# Patient Record
Sex: Male | Born: 1955
Health system: Southern US, Community
[De-identification: ages and names within clinical notes are randomized; demographics above are authoritative.]

## PROBLEM LIST (undated history)

## (undated) DIAGNOSIS — M1990 Unspecified osteoarthritis, unspecified site: Secondary | ICD-10-CM

## (undated) DIAGNOSIS — M5386 Other specified dorsopathies, lumbar region: Secondary | ICD-10-CM

## (undated) DIAGNOSIS — M549 Dorsalgia, unspecified: Secondary | ICD-10-CM

## (undated) DIAGNOSIS — I10 Essential (primary) hypertension: Secondary | ICD-10-CM

## (undated) DIAGNOSIS — K219 Gastro-esophageal reflux disease without esophagitis: Secondary | ICD-10-CM

## (undated) HISTORY — PX: MEDIAL PARTIAL KNEE REPLACEMENT: SHX5965

## (undated) HISTORY — DX: Essential (primary) hypertension: I10

## (undated) HISTORY — DX: Unspecified osteoarthritis, unspecified site: M19.90

## (undated) HISTORY — PX: SHOULDER SURGERY: SHX246

## (undated) HISTORY — DX: Gastro-esophageal reflux disease without esophagitis: K21.9

---

## 2001-08-21 ENCOUNTER — Encounter: Payer: Self-pay | Admitting: Orthopedic Surgery

## 2001-08-21 ENCOUNTER — Inpatient Hospital Stay (HOSPITAL_COMMUNITY): Admission: RE | Admit: 2001-08-21 | Discharge: 2001-08-25 | Payer: Self-pay | Admitting: Orthopedic Surgery

## 2001-08-23 ENCOUNTER — Encounter: Payer: Self-pay | Admitting: Orthopedic Surgery

## 2004-09-03 ENCOUNTER — Ambulatory Visit: Payer: Self-pay | Admitting: Family Medicine

## 2004-09-10 ENCOUNTER — Ambulatory Visit: Payer: Self-pay | Admitting: Family Medicine

## 2004-09-21 ENCOUNTER — Ambulatory Visit: Payer: Self-pay | Admitting: Gastroenterology

## 2004-10-02 ENCOUNTER — Ambulatory Visit (HOSPITAL_COMMUNITY): Admission: RE | Admit: 2004-10-02 | Discharge: 2004-10-02 | Payer: Self-pay | Admitting: Gastroenterology

## 2004-10-02 ENCOUNTER — Ambulatory Visit: Payer: Self-pay | Admitting: Gastroenterology

## 2005-09-14 ENCOUNTER — Ambulatory Visit: Payer: Self-pay | Admitting: Family Medicine

## 2005-09-20 ENCOUNTER — Ambulatory Visit: Payer: Self-pay | Admitting: Family Medicine

## 2005-09-23 ENCOUNTER — Ambulatory Visit: Payer: Self-pay | Admitting: Gastroenterology

## 2005-10-18 ENCOUNTER — Ambulatory Visit: Payer: Self-pay | Admitting: Gastroenterology

## 2006-08-08 ENCOUNTER — Ambulatory Visit: Payer: Self-pay | Admitting: Family Medicine

## 2006-08-22 ENCOUNTER — Inpatient Hospital Stay (HOSPITAL_COMMUNITY): Admission: RE | Admit: 2006-08-22 | Discharge: 2006-08-24 | Payer: Self-pay | Admitting: Orthopedic Surgery

## 2006-09-08 ENCOUNTER — Ambulatory Visit: Payer: Self-pay | Admitting: Family Medicine

## 2006-10-21 ENCOUNTER — Ambulatory Visit: Payer: Self-pay | Admitting: Family Medicine

## 2006-10-31 ENCOUNTER — Ambulatory Visit: Payer: Self-pay | Admitting: Gastroenterology

## 2006-11-07 ENCOUNTER — Ambulatory Visit: Payer: Self-pay | Admitting: Gastroenterology

## 2006-11-29 ENCOUNTER — Ambulatory Visit: Payer: Self-pay | Admitting: Gastroenterology

## 2006-12-05 ENCOUNTER — Ambulatory Visit: Payer: Self-pay | Admitting: Family Medicine

## 2006-12-05 LAB — CONVERTED CEMR LAB
ALT: 27 units/L (ref 0–40)
AST: 24 units/L (ref 0–37)
Albumin: 3.6 g/dL (ref 3.5–5.2)
Alkaline Phosphatase: 81 units/L (ref 39–117)
BUN: 13 mg/dL (ref 6–23)
Basophils Absolute: 0 10*3/uL (ref 0.0–0.1)
Basophils Relative: 0.6 % (ref 0.0–1.0)
Bilirubin, Direct: 0.1 mg/dL (ref 0.0–0.3)
CO2: 33 meq/L — ABNORMAL HIGH (ref 19–32)
Calcium: 9.4 mg/dL (ref 8.4–10.5)
Chloride: 105 meq/L (ref 96–112)
Cholesterol: 156 mg/dL (ref 0–200)
Creatinine, Ser: 1 mg/dL (ref 0.4–1.5)
Eosinophils Absolute: 0.2 10*3/uL (ref 0.0–0.6)
Eosinophils Relative: 4.2 % (ref 0.0–5.0)
GFR calc Af Amer: 102 mL/min
GFR calc non Af Amer: 84 mL/min
Glucose, Bld: 104 mg/dL — ABNORMAL HIGH (ref 70–99)
HCT: 38.8 % — ABNORMAL LOW (ref 39.0–52.0)
HDL: 45.6 mg/dL (ref >39.0)
Hemoglobin: 12.9 g/dL — ABNORMAL LOW (ref 13.0–17.0)
LDL Cholesterol: 95 mg/dL (ref 0–99)
Lymphocytes Relative: 31.7 % (ref 12.0–46.0)
MCHC: 33.2 g/dL (ref 30.0–36.0)
MCV: 78.1 fL (ref 78.0–100.0)
Monocytes Absolute: 0.5 10*3/uL (ref 0.2–0.7)
Monocytes Relative: 11.2 % — ABNORMAL HIGH (ref 3.0–11.0)
Neutro Abs: 2.4 10*3/uL (ref 1.4–7.7)
Neutrophils Relative %: 52.3 % (ref 43.0–77.0)
PSA: 0.76 ng/mL (ref 0.10–4.00)
Platelets: 320 10*3/uL (ref 150–400)
Potassium: 4.7 meq/L (ref 3.5–5.1)
RBC: 4.96 M/uL (ref 4.22–5.81)
RDW: 14.3 % (ref 11.5–14.6)
Sodium: 142 meq/L (ref 135–145)
TSH: 1.55 microintl units/mL (ref 0.35–5.50)
Total Bilirubin: 0.6 mg/dL (ref 0.3–1.2)
Total CHOL/HDL Ratio: 3.4
Total Protein: 7.3 g/dL (ref 6.0–8.3)
Triglycerides: 76 mg/dL (ref 0–149)
VLDL: 15 mg/dL (ref 0–40)
WBC: 4.5 10*3/uL (ref 4.5–10.5)

## 2006-12-13 ENCOUNTER — Ambulatory Visit: Payer: Self-pay | Admitting: Family Medicine

## 2007-06-29 DIAGNOSIS — M1990 Unspecified osteoarthritis, unspecified site: Secondary | ICD-10-CM | POA: Insufficient documentation

## 2007-06-29 DIAGNOSIS — I10 Essential (primary) hypertension: Secondary | ICD-10-CM | POA: Insufficient documentation

## 2007-06-29 DIAGNOSIS — J309 Allergic rhinitis, unspecified: Secondary | ICD-10-CM | POA: Insufficient documentation

## 2007-06-29 DIAGNOSIS — F17200 Nicotine dependence, unspecified, uncomplicated: Secondary | ICD-10-CM | POA: Insufficient documentation

## 2007-11-29 ENCOUNTER — Telehealth: Payer: Self-pay | Admitting: *Deleted

## 2007-11-29 ENCOUNTER — Telehealth: Payer: Self-pay | Admitting: Family Medicine

## 2007-12-11 ENCOUNTER — Ambulatory Visit: Payer: Self-pay | Admitting: Family Medicine

## 2007-12-11 LAB — CONVERTED CEMR LAB
Albumin: 3.7 g/dL (ref 3.5–5.2)
BUN: 16 mg/dL (ref 6–23)
Basophils Relative: 0.3 % (ref 0.0–1.0)
Blood in Urine, dipstick: NEGATIVE
Calcium: 9.4 mg/dL (ref 8.4–10.5)
Creatinine, Ser: 1.1 mg/dL (ref 0.4–1.5)
Eosinophils Absolute: 0.3 10*3/uL (ref 0.0–0.7)
Eosinophils Relative: 6.5 % — ABNORMAL HIGH (ref 0.0–5.0)
GFR calc Af Amer: 91 mL/min
GFR calc non Af Amer: 75 mL/min
Glucose, Bld: 102 mg/dL — ABNORMAL HIGH (ref 70–99)
Glucose, Urine, Semiquant: NEGATIVE
HCT: 42 % (ref 39.0–52.0)
HDL: 32.3 mg/dL — ABNORMAL LOW (ref >39.0)
Hemoglobin: 13.4 g/dL (ref 13.0–17.0)
Ketones, urine, test strip: NEGATIVE
MCV: 81.9 fL (ref 78.0–100.0)
Monocytes Absolute: 0.5 10*3/uL (ref 0.1–1.0)
Neutro Abs: 2.3 10*3/uL (ref 1.4–7.7)
PSA: 0.9 ng/mL (ref 0.10–4.00)
Platelets: 243 10*3/uL (ref 150–400)
Potassium: 5 meq/L (ref 3.5–5.1)
Specific Gravity, Urine: >=1.03
Total Protein: 7 g/dL (ref 6.0–8.3)
WBC Urine, dipstick: NEGATIVE
WBC: 4.6 10*3/uL (ref 4.5–10.5)
pH: 5.5

## 2007-12-17 ENCOUNTER — Encounter: Payer: Self-pay | Admitting: Family Medicine

## 2007-12-18 ENCOUNTER — Ambulatory Visit: Payer: Self-pay | Admitting: Family Medicine

## 2007-12-18 DIAGNOSIS — L301 Dyshidrosis [pompholyx]: Secondary | ICD-10-CM | POA: Insufficient documentation

## 2008-03-11 IMAGING — CR DG CHEST 2V
2 series · 2 of 2 positions shown · non-contrast
Comparison: 08/21/2001

CLINICAL DATA: DJD left knee, preop

CHEST - 2 VIEW:

[view not recorded (1 of 2)]
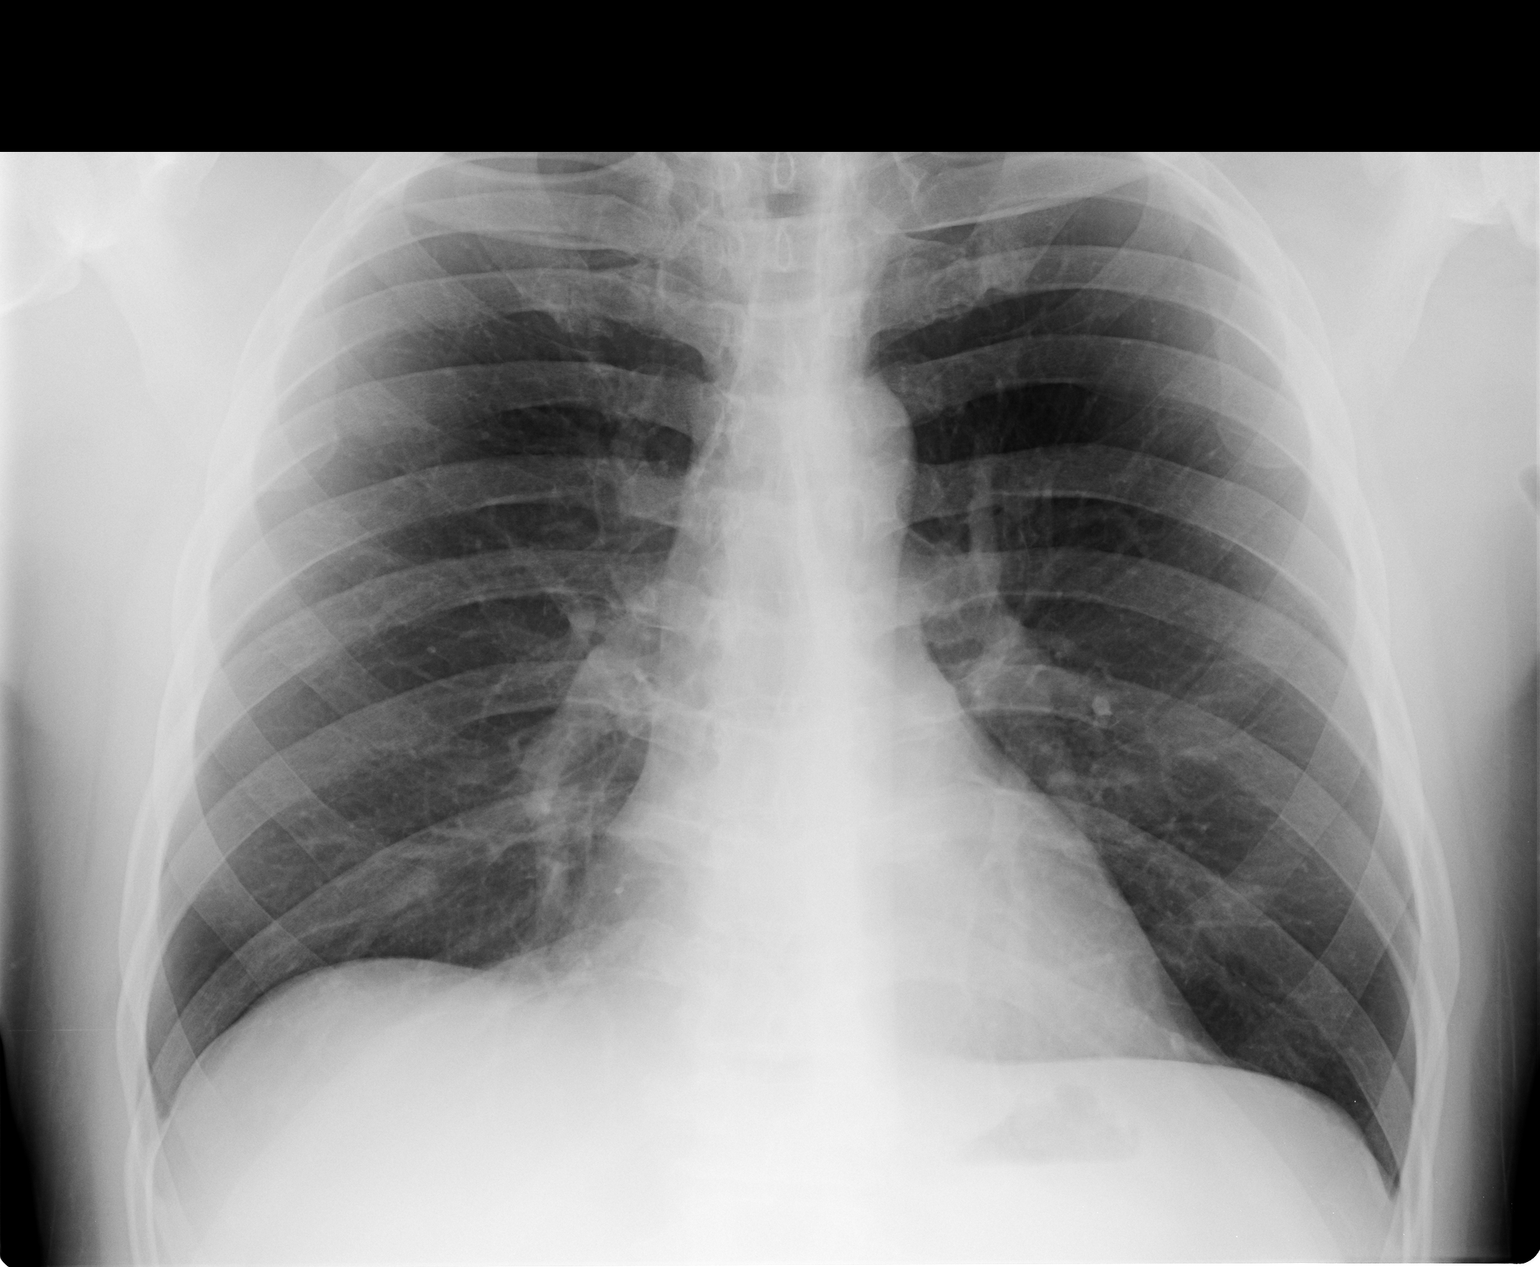

[view not recorded (2 of 2)]
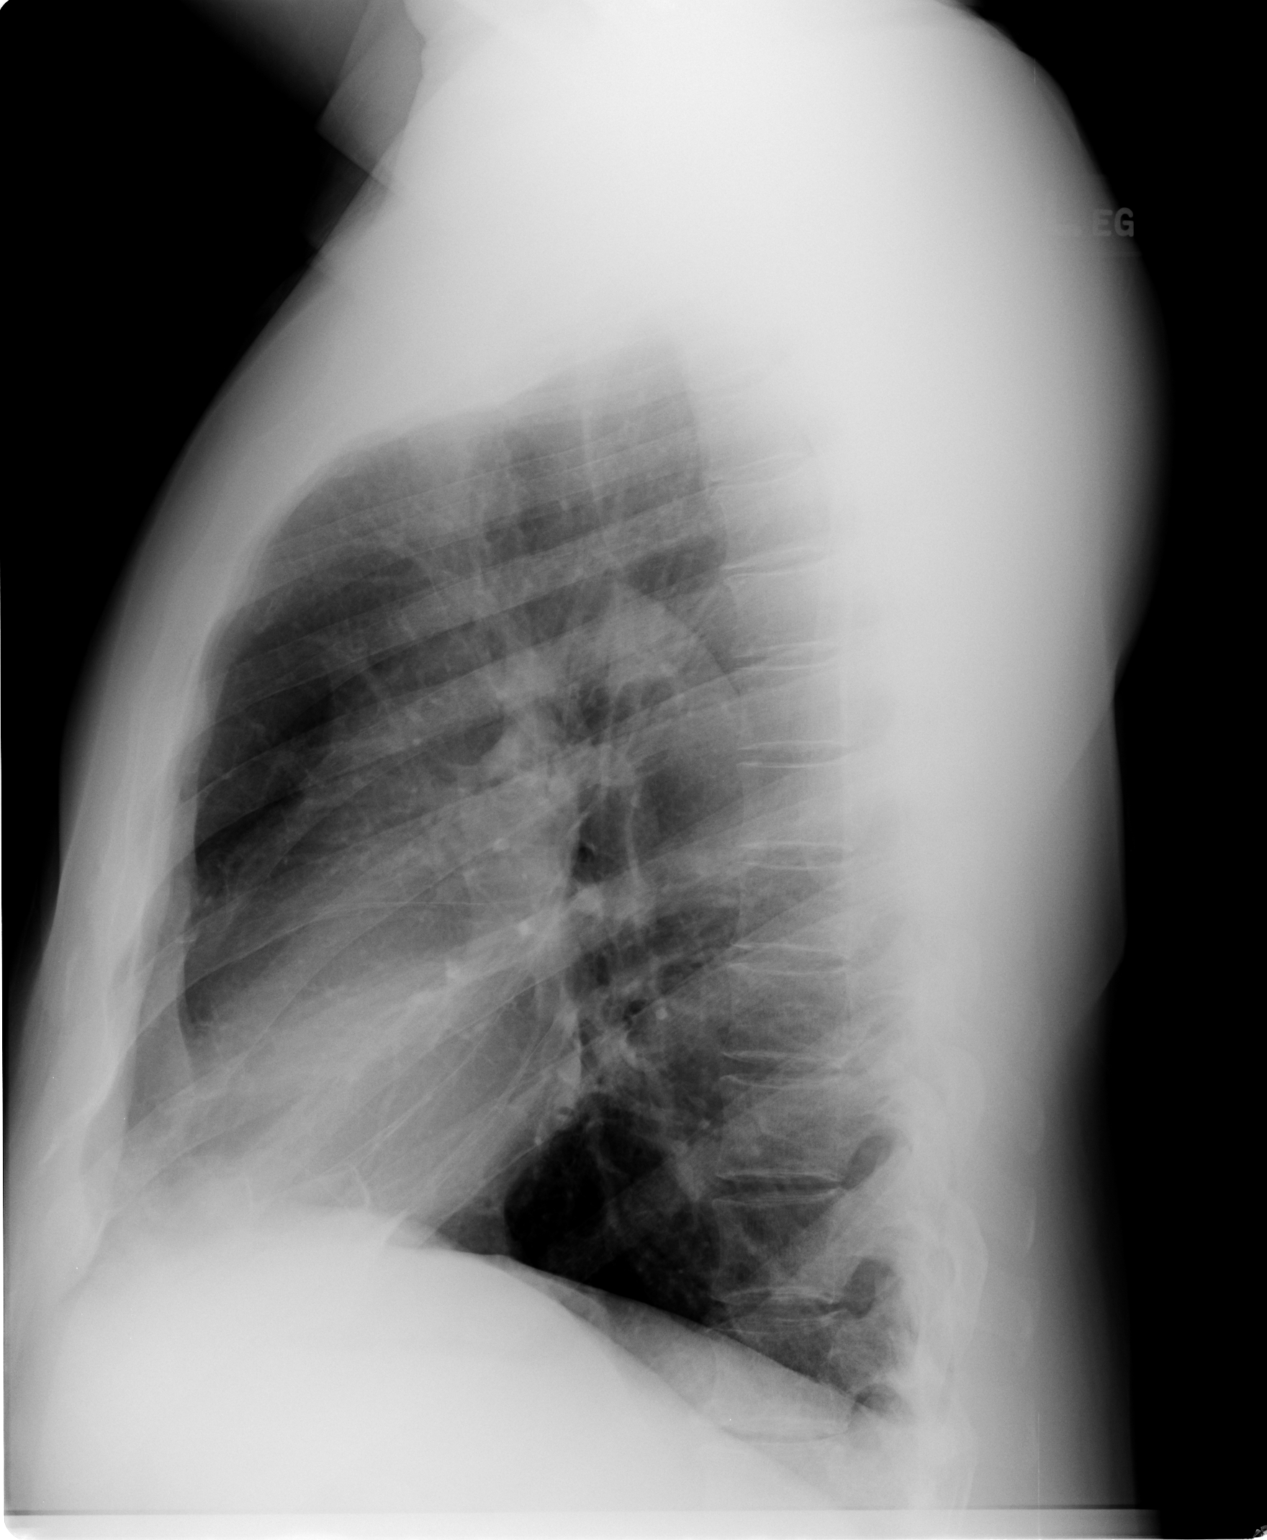

[2 of 2 positions shown; findings below may reference images not displayed]

FINDINGS: The heart size and mediastinal contours are within normal limits. 
Both lungs are clear.  The visualized skeletal structures are unremarkable.
IMPRESSION: No active cardiopulmonary disease

## 2008-06-04 ENCOUNTER — Telehealth: Payer: Self-pay | Admitting: Gastroenterology

## 2008-08-07 ENCOUNTER — Ambulatory Visit: Payer: Self-pay | Admitting: Gastroenterology

## 2008-08-07 DIAGNOSIS — K5730 Diverticulosis of large intestine without perforation or abscess without bleeding: Secondary | ICD-10-CM | POA: Insufficient documentation

## 2008-08-07 DIAGNOSIS — K219 Gastro-esophageal reflux disease without esophagitis: Secondary | ICD-10-CM | POA: Insufficient documentation

## 2008-08-07 DIAGNOSIS — K222 Esophageal obstruction: Secondary | ICD-10-CM | POA: Insufficient documentation

## 2008-12-12 ENCOUNTER — Ambulatory Visit: Payer: Self-pay | Admitting: Family Medicine

## 2008-12-12 LAB — CONVERTED CEMR LAB
ALT: 29 units/L (ref 0–53)
AST: 26 units/L (ref 0–37)
Albumin: 3.5 g/dL (ref 3.5–5.2)
BUN: 12 mg/dL (ref 6–23)
Bilirubin Urine: NEGATIVE
Chloride: 103 meq/L (ref 96–112)
Cholesterol: 148 mg/dL (ref 0–200)
Eosinophils Relative: 2.6 % (ref 0.0–5.0)
GFR calc non Af Amer: 113.8 mL/min (ref >60)
Glucose, Bld: 104 mg/dL — ABNORMAL HIGH (ref 70–99)
Glucose, Urine, Semiquant: NEGATIVE
HCT: 40.1 % (ref 39.0–52.0)
Hemoglobin: 13.3 g/dL (ref 13.0–17.0)
LDL Cholesterol: 94 mg/dL (ref 0–99)
Lymphs Abs: 1.4 10*3/uL (ref 0.7–4.0)
MCV: 82.4 fL (ref 78.0–100.0)
Monocytes Absolute: 0.5 10*3/uL (ref 0.1–1.0)
Monocytes Relative: 8.4 % (ref 3.0–12.0)
Neutro Abs: 3.6 10*3/uL (ref 1.4–7.7)
PSA: 0.91 ng/mL (ref 0.10–4.00)
Platelets: 207 10*3/uL (ref 150.0–400.0)
Potassium: 3.6 meq/L (ref 3.5–5.1)
Sodium: 143 meq/L (ref 135–145)
TSH: 0.91 microintl units/mL (ref 0.35–5.50)
Total Protein: 6.9 g/dL (ref 6.0–8.3)
VLDL: 16 mg/dL (ref 0.0–40.0)
WBC Urine, dipstick: NEGATIVE
WBC: 5.6 10*3/uL (ref 4.5–10.5)
pH: 6

## 2008-12-29 ENCOUNTER — Encounter: Payer: Self-pay | Admitting: Family Medicine

## 2008-12-30 ENCOUNTER — Ambulatory Visit: Payer: Self-pay | Admitting: Family Medicine

## 2009-09-03 ENCOUNTER — Inpatient Hospital Stay (HOSPITAL_COMMUNITY): Admission: RE | Admit: 2009-09-03 | Discharge: 2009-09-05 | Payer: Self-pay | Admitting: Orthopedic Surgery

## 2009-09-06 HISTORY — PX: SHOULDER SURGERY: SHX246

## 2009-09-06 HISTORY — PX: KNEE ARTHROSCOPY: SHX127

## 2009-12-22 ENCOUNTER — Ambulatory Visit: Payer: Self-pay | Admitting: Family Medicine

## 2009-12-22 LAB — CONVERTED CEMR LAB
ALT: 21 units/L (ref 0–53)
Alkaline Phosphatase: 64 units/L (ref 39–117)
Basophils Relative: 0.5 % (ref 0.0–3.0)
Bilirubin, Direct: 0 mg/dL (ref 0.0–0.3)
Chloride: 110 meq/L (ref 96–112)
Creatinine, Ser: 1 mg/dL (ref 0.4–1.5)
Eosinophils Relative: 4.3 % (ref 0.0–5.0)
Hemoglobin: 12 g/dL — ABNORMAL LOW (ref 13.0–17.0)
LDL Cholesterol: 79 mg/dL (ref 0–99)
Lymphocytes Relative: 32.5 % (ref 12.0–46.0)
MCV: 78.7 fL (ref 78.0–100.0)
Monocytes Absolute: 0.5 10*3/uL (ref 0.1–1.0)
Neutro Abs: 2.3 10*3/uL (ref 1.4–7.7)
Neutrophils Relative %: 51.1 % (ref 43.0–77.0)
Nitrite: NEGATIVE
RBC: 4.58 M/uL (ref 4.22–5.81)
Sodium: 146 meq/L — ABNORMAL HIGH (ref 135–145)
Specific Gravity, Urine: 1.02
Total CHOL/HDL Ratio: 3
Total Protein: 6.9 g/dL (ref 6.0–8.3)
Triglycerides: 75 mg/dL (ref 0.0–149.0)
Urobilinogen, UA: 0.2
WBC Urine, dipstick: NEGATIVE
WBC: 4.5 10*3/uL (ref 4.5–10.5)

## 2010-01-05 ENCOUNTER — Telehealth: Payer: Self-pay | Admitting: Family Medicine

## 2010-01-05 ENCOUNTER — Ambulatory Visit: Payer: Self-pay | Admitting: Family Medicine

## 2010-02-25 ENCOUNTER — Telehealth: Payer: Self-pay | Admitting: Family Medicine

## 2010-03-16 ENCOUNTER — Telehealth: Payer: Self-pay | Admitting: Family Medicine

## 2010-09-06 HISTORY — PX: KNEE ARTHROPLASTY: SHX992

## 2010-10-06 NOTE — Assessment & Plan Note (Signed)
Summary: cpx/cjr   Vital Signs:  Patient profile:   55 year old male Height:      75.75 inches Weight:      261 pounds BMI:     32.10 Temp:     98.4 degrees F oral BP sitting:   120 / 80  (left arm) Cuff size:   regular  Vitals Entered By: Kern Reap CMA Duncan Dull) (Jan 05, 2010 10:33 AM) CC: cpx Is Patient Diabetic? No Pain Assessment Patient in pain? no        Primary Care Provider:  Kelle Darting, MD  CC:  cpx.  History of Present Illness: [name] is a 55 year old male who comes in today for evaluation of hypertension, allergic rhinitis, reflux, arthritis, and osteoarthritis.  For hypertension.  He takes Cardura 8 mg daily, or thiazide 25 mg daily, enalapril 20 mg daily.  BP 120/80.  For allergic rhinitis.  He uses a steroid nasal spray daily.  For reflux esophagitis.  He takes omeprazole 20 mg daily.  In December.  He had a redo of his left total knee replacement.  He takes Motrin 800 mg b.i.d.  He gets routine eye care, dental care, colonoscopy x 2 in GI because he had a polyp.  Tetanus 2009, seasonal flu 2010.  Allergies: 1)  ! Morphine  Past History:  Past medical, surgical, family and social histories (including risk factors) reviewed, and no changes noted (except as noted below).  Past Medical History: Reviewed history from 12/18/2007 and no changes required. Allergic rhinitis Hypertension DJD tobacco abuse  ex L carotid bruit bilateral total knee replacements  Past Surgical History: alcoholism Total knee replacement--L 2002, R 2007 redo left knee December 2010  Family History: Reviewed history from 12/18/2007 and no changes required. father died at 46 of an MI.  He had underlying hypertension, smoker, coronary disease, asthma, and alcoholism mother 24, retired Engineer, civil (consulting).  No brothers two sisters in good health  Social History: Reviewed history from 12/18/2007 and no changes required. Occupation: Married Former Smoker Alcohol use-no Drug  use-no Regular exercise-yes  Review of Systems      See HPI  Physical Exam  General:  Well-developed,well-nourished,in no acute distress; alert,appropriate and cooperative throughout examination Head:  Normocephalic and atraumatic without obvious abnormalities. No apparent alopecia or balding. Eyes:  No corneal or conjunctival inflammation noted. EOMI. Perrla. Funduscopic exam benign, without hemorrhages, exudates or papilledema. Vision grossly normal. Ears:  External ear exam shows no significant lesions or deformities.  Otoscopic examination reveals clear canals, tympanic membranes are intact bilaterally without bulging, retraction, inflammation or discharge. Hearing is grossly normal bilaterally. Nose:  External nasal examination shows no deformity or inflammation. Nasal mucosa are pink and moist without lesions or exudates. Mouth:  Oral mucosa and oropharynx without lesions or exudates.  Teeth in good repair. Neck:  No deformities, masses, or tenderness noted. Chest Wall:  No deformities, masses, tenderness or gynecomastia noted. Breasts:  No masses or gynecomastia noted Lungs:  Normal respiratory effort, chest expands symmetrically. Lungs are clear to auscultation, no crackles or wheezes. Heart:  Normal rate and regular rhythm. S1 and S2 normal without gallop, murmur, click, rub or other extra sounds. Abdomen:  Bowel sounds positive,abdomen soft and non-tender without masses, organomegaly or hernias noted. Rectal:  No external abnormalities noted. Normal sphincter tone. No rectal masses or tenderness. Genitalia:  Testes bilaterally descended without nodularity, tenderness or masses. No scrotal masses or lesions. No penis lesions or urethral discharge. Prostate:  Prostate gland firm and smooth,  no enlargement, nodularity, tenderness, mass, asymmetry or induration. Msk:  No deformity or scoliosis noted of thoracic or lumbar spine.   Pulses:  R and L carotid,radial,femoral,dorsalis pedis  and posterior tibial pulses are full and equal bilaterally Extremities:  No clubbing, cyanosis, edema, or deformity noted with normal full range of motion of all joints.   Neurologic:  No cranial nerve deficits noted. Station and gait are normal. Plantar reflexes are down-going bilaterally. DTRs are symmetrical throughout. Sensory, motor and coordinative functions appear intact. Skin:  total body skin exam normal, including well healed scar, left knee from previous surgery in December Cervical Nodes:  No lymphadenopathy noted Axillary Nodes:  No palpable lymphadenopathy Inguinal Nodes:  No significant adenopathy Psych:  Cognition and judgment appear intact. Alert and cooperative with normal attention span and concentration. No apparent delusions, illusions, hallucinations   Impression & Recommendations:  Problem # 1:  ESOPHAGEAL REFLUX (ICD-530.81) Assessment Improved  His updated medication list for this problem includes:    Omeprazole 20 Mg Cpdr (Omeprazole) .[name]... 1 each day 30 minutes before meal  Orders: Prescription Created Electronically [phone])  Problem # 2:  HYPERTENSION (ICD-401.9) Assessment: Improved  His updated medication list for this problem includes:    Cardura 8 Mg Tabs (Doxazosin mesylate) .[name]... Take 1 tablet by mouth once a day    Hydrochlorothiazide 25 Mg Tabs (Hydrochlorothiazide) .[name]... Take 1 tablet by mouth once a day    Enalapril Maleate 20 Mg Tabs (Enalapril maleate) .[name]... Take 1 tablet by mouth once a day  Orders: Prescription Created Electronically [phone])  Problem # 3:  ALLERGIC RHINITIS (ICD-477.9) Assessment: Improved  The following medications were removed from the medication list:    Allegra 180 Mg Tabs (Fexofenadine hcl) ..... One tab every by mouth daily His updated medication list for this problem includes:    Flunisolide 29 Mcg/act Soln (Flunisolide) ..... Nasal spray two times a day  Orders: Prescription Created Electronically  [phone])  Problem # 4:  Preventive Health Care (ICD-V70.0) Assessment: Unchanged  Orders: Prescription Created Electronically [phone])  Complete Medication List: 1)  Flunisolide 29 Mcg/act Soln (Flunisolide) .... Nasal spray two times a day 2)  Cardura 8 Mg Tabs (Doxazosin mesylate) .... Take 1 tablet by mouth once a day 3)  Hydrochlorothiazide 25 Mg Tabs (Hydrochlorothiazide) .... Take 1 tablet by mouth once a day 4)  Enalapril Maleate 20 Mg Tabs (Enalapril maleate) .... Take 1 tablet by mouth once a day 5)  Omeprazole 20 Mg Cpdr (Omeprazole) .[name].. 1 each day 30 minutes before meal 6)  Ibuprofen 800 Mg Tabs (Ibuprofen) .... Take one tab by mouth three times a day as needed  Patient Instructions: 1)  continue current medications return in one year for follow-up Prescriptions: IBUPROFEN 800 MG TABS (IBUPROFEN) take one tab by mouth three times a day as needed  #300 x 3   Entered and Authorized by:   Roderick Pee MD   Signed by:   Roderick Pee MD on 01/05/2010   Method used:   [facility]ronically to        [id number]       [address]       [place], [place]  [address]       Ph: [phone] or [phone]       Fax: [phone]   RxID:   [id number] OMEPRAZOLE 20 MG  CPDR (OMEPRAZOLE) 1 each day 30 minutes before meal  #100  Capsule x 3   Entered and Authorized by:   Roderick Pee MD   Signed by:   Roderick Pee MD on 01/05/2010   Method used:   [facility]ically to        [facility] [id number] ([facility]       [address]       [place], [place]  [address]       Ph: [phone] or [phone]       Fax: [phone]   RxID:   [id number] ENALAPRIL MALEATE 20 MG  TABS (ENALAPRIL MALEATE) Take 1 tablet by mouth once a day  #100 Tablet x 3   Entered and Authorized by:   Roderick Pee MD   Signed by:   Roderick Pee MD on 01/05/2010   Method used:   [facility]ically to        [id number]       [address]       [place], [place]  [address]       Ph: [phone] or [phone]       Fax: [phone]   RxID:   [id number] HYDROCHLOROTHIAZIDE 25 MG  TABS (HYDROCHLOROTHIAZIDE) Take 1 tablet by mouth once a day  #100 Tablet x 3   Entered and Authorized by:   Roderick Pee MD   Signed by:   Roderick Pee MD on 01/05/2010   Method used:   [facility]ically to        [facility] [id number]       [address]       [place], [place]  [address]       Ph: [phone] or [phone]       Fax: [phone]   RxID:   [id number] CARDURA 8 MG  TABS (DOXAZOSIN MESYLATE) Take 1 tablet by mouth once a day  #100 Tablet x 3   Entered and Authorized by:   Roderick Pee MD   Signed by:   Roderick Pee MD on 01/05/2010   Method used:   [facility]ically to        [facility] [id number]       [address]       [place], [place]  [address]       Ph: [phone] or [phone]       Fax: [phone]   RxID:   [id number] FLUNISOLIDE 29 MCG/ACT SOLN (FLUNISOLIDE) nasal spray two times a day  #3 units x 3   Entered and Authorized by:   Roderick Pee MD   Signed by:   Roderick Pee MD on 01/05/2010   Method used:   [facility]ronically to        [facility] [id number]       [address]       [place], [place]  [address]       Ph: [phone] or [phone]       Fax: [phone]   RxID:   [id number]    Immunization History:  Influenza Immunization History:    Influenza:  historical (06/06/2009)

## 2010-10-06 NOTE — Progress Notes (Signed)
Summary: lasix  Phone Note Call from Patient Call back at [phone]   Summary of Call: Dose of lasix doubled to 2 20mg  tabs.  90day supply used up early.  Need new script doubled 40 mg one daily or 2 20mg  [facility].  Doubled dose seems to reduce the leg swelling for the 2 weeks so far.   Initial call taken by: Rudy Jew, RN,  March 16, 2010 8:23 AM  Follow-up for Phone Call        Lasix 40 mg, number 100, directions one q.a.m., refills x 3 Follow-up by: Roderick Pee MD,  March 16, 2010 8:24 AM  Additional Follow-up for Phone Call Additional follow up Details #1::        Phone Call Completed Additional Follow-up by: Rudy Jew, RN,  March 16, 2010 11:27 AM    New/Updated Medications: LASIX 40 MG TABS (FUROSEMIDE) One every am Prescriptions: LASIX 40 MG TABS (FUROSEMIDE) One every am  #100 x 3   Entered by:   Rudy Jew, RN   Authorized by:   Roderick Pee MD   Signed by:   Rudy Jew, RN on 03/16/2010   Method used:   [facility]ically to        [id number]       [address]       [place], [place]  [address]       Ph: [phone] or [phone]       Fax: [phone]   RxID:   [id number]

## 2010-10-06 NOTE — Progress Notes (Signed)
Summary: questions about Lasix  Phone Note Call from Patient   Caller: Patient Call For: Roderick Pee MD Summary of Call: Pt wants to know that his Lasix is working fairly well, but is still having some swelling in legs, and asking if he should change meds. [phone] Initial call taken by: Lynann Beaver CMA,  February 25, 2010 10:59 AM  Follow-up for Phone Call        doubled the dose Follow-up by: Roderick Pee MD,  February 25, 2010 11:08 AM  Additional Follow-up for Phone Call Additional follow up Details #1::        Pt advised. Additional Follow-up by: Lynann Beaver CMA,  February 25, 2010 11:39 AM    New/Updated Medications: FUROSEMIDE 20 MG TABS (FUROSEMIDE) take 2 per day

## 2010-10-06 NOTE — Progress Notes (Signed)
Summary: rx concern  Phone Note Call from Patient   Summary of Call: patient is calling because he went to pick up his refills today at the pharmacy and they stated it was too early for his HCTZ.  He would like to know if he can wait until the next fill date (90days) to fill the new rx?  [phone] Initial call taken by: Kern Reap CMA Duncan Dull),  Jan 05, 2010 1:57 PM  Follow-up for Phone Call        because of the edema I would stop the HCTZ and start Lasix 20 mg q.a.m. dispense 100 tablets, refills x 3.  Return if this does not decrease the swelling in two to 3 weeks Follow-up by: Roderick Pee MD,  Jan 05, 2010 2:03 PM  Additional Follow-up for Phone Call Additional follow up Details #1::        Phone Call Completed Additional Follow-up by: Kern Reap CMA Duncan Dull),  Jan 05, 2010 3:20 PM    New/Updated Medications: FUROSEMIDE 20 MG TABS (FUROSEMIDE) take one tab by mouth every morning Prescriptions: FUROSEMIDE 20 MG TABS (FUROSEMIDE) take one tab by mouth every morning  #100 x 3   Entered by:   Kern Reap CMA (AAMA)   Authorized by:   Roderick Pee MD   Signed by:   Kern Reap CMA (AAMA) on 01/05/2010   Method used:   [facility]        [id number]       [address]       [place], [place]  [address]       Ph: [phone] or [phone]       Fax: [phone]   RxID:   [id number]

## 2010-12-07 LAB — DIFFERENTIAL
Basophils Absolute: 0 10*3/uL (ref 0.0–0.1)
Basophils Relative: 0 % (ref 0–1)
Eosinophils Relative: 4 % (ref 0–5)
Monocytes Absolute: 0.6 10*3/uL (ref 0.1–1.0)

## 2010-12-07 LAB — CBC
HCT: 30.4 % — ABNORMAL LOW (ref 39.0–52.0)
HCT: 34.5 % — ABNORMAL LOW (ref 39.0–52.0)
HCT: 42 % (ref 39.0–52.0)
Hemoglobin: 10.1 g/dL — ABNORMAL LOW (ref 13.0–17.0)
Hemoglobin: 11.3 g/dL — ABNORMAL LOW (ref 13.0–17.0)
Hemoglobin: 13.9 g/dL (ref 13.0–17.0)
MCHC: 33 g/dL (ref 30.0–36.0)
MCV: 82.5 fL (ref 78.0–100.0)
Platelets: 199 10*3/uL (ref 150–400)
RBC: 4.18 MIL/uL — ABNORMAL LOW (ref 4.22–5.81)
RDW: 14.1 % (ref 11.5–15.5)
RDW: 14.2 % (ref 11.5–15.5)
WBC: 7.9 10*3/uL (ref 4.0–10.5)

## 2010-12-07 LAB — BASIC METABOLIC PANEL
CO2: 30 mEq/L (ref 19–32)
CO2: 31 mEq/L (ref 19–32)
GFR calc non Af Amer: >60 mL/min (ref >60)
Glucose, Bld: 171 mg/dL — ABNORMAL HIGH (ref 70–99)
Glucose, Bld: 91 mg/dL (ref 70–99)
Potassium: 4.4 mEq/L (ref 3.5–5.1)
Potassium: 4.5 mEq/L (ref 3.5–5.1)
Sodium: 136 mEq/L (ref 135–145)
Sodium: 138 mEq/L (ref 135–145)

## 2010-12-07 LAB — BODY FLUID CULTURE: Culture: NO GROWTH

## 2010-12-07 LAB — ANAEROBIC CULTURE

## 2011-01-05 ENCOUNTER — Other Ambulatory Visit (INDEPENDENT_AMBULATORY_CARE_PROVIDER_SITE_OTHER): Payer: Self-pay | Admitting: Family Medicine

## 2011-01-05 DIAGNOSIS — Z0000 Encounter for general adult medical examination without abnormal findings: Secondary | ICD-10-CM

## 2011-01-05 LAB — BASIC METABOLIC PANEL
BUN: 15 mg/dL (ref 6–23)
CO2: 29 mEq/L (ref 19–32)
Chloride: 109 mEq/L (ref 96–112)
GFR: 99.99 mL/min (ref >60.00)
Glucose, Bld: 97 mg/dL (ref 70–99)
Potassium: 4.6 mEq/L (ref 3.5–5.1)

## 2011-01-05 LAB — POCT URINALYSIS DIPSTICK
Glucose, UA: NEGATIVE
Ketones, UA: NEGATIVE
Protein, UA: NEGATIVE
Spec Grav, UA: 1.015
Urobilinogen, UA: 0.2

## 2011-01-05 LAB — CBC WITH DIFFERENTIAL/PLATELET
Basophils Absolute: 0 10*3/uL (ref 0.0–0.1)
HCT: 39.5 % (ref 39.0–52.0)
Hemoglobin: 12.7 g/dL — ABNORMAL LOW (ref 13.0–17.0)
Lymphs Abs: 1.4 10*3/uL (ref 0.7–4.0)
MCHC: 32.2 g/dL (ref 30.0–36.0)
MCV: 82.9 fl (ref 78.0–100.0)
Monocytes Absolute: 0.4 10*3/uL (ref 0.1–1.0)
Neutro Abs: 2.1 10*3/uL (ref 1.4–7.7)
Platelets: 225 10*3/uL (ref 150.0–400.0)
RDW: 14.6 % (ref 11.5–14.6)

## 2011-01-05 LAB — PSA: PSA: 1.14 ng/mL (ref 0.10–4.00)

## 2011-01-05 LAB — HEPATIC FUNCTION PANEL
Albumin: 3.5 g/dL (ref 3.5–5.2)
Total Bilirubin: 0.3 mg/dL (ref 0.3–1.2)

## 2011-01-05 LAB — LIPID PANEL
Cholesterol: 140 mg/dL (ref 0–200)
LDL Cholesterol: 96 mg/dL (ref 0–99)
Triglycerides: 45 mg/dL (ref 0.0–149.0)
VLDL: 9 mg/dL (ref 0.0–40.0)

## 2011-01-05 LAB — TSH: TSH: 1.48 u[IU]/mL (ref 0.35–5.50)

## 2011-01-12 ENCOUNTER — Encounter: Payer: Self-pay | Admitting: Family Medicine

## 2011-01-12 ENCOUNTER — Ambulatory Visit (INDEPENDENT_AMBULATORY_CARE_PROVIDER_SITE_OTHER): Payer: 59 | Admitting: Family Medicine

## 2011-01-12 DIAGNOSIS — R0989 Other specified symptoms and signs involving the circulatory and respiratory systems: Secondary | ICD-10-CM

## 2011-01-12 DIAGNOSIS — K219 Gastro-esophageal reflux disease without esophagitis: Secondary | ICD-10-CM

## 2011-01-12 DIAGNOSIS — J309 Allergic rhinitis, unspecified: Secondary | ICD-10-CM

## 2011-01-12 DIAGNOSIS — I10 Essential (primary) hypertension: Secondary | ICD-10-CM

## 2011-01-12 DIAGNOSIS — M1990 Unspecified osteoarthritis, unspecified site: Secondary | ICD-10-CM

## 2011-01-12 MED ORDER — IBUPROFEN 800 MG PO TABS: ORAL_TABLET | ORAL | Status: DC

## 2011-01-12 MED ORDER — FUROSEMIDE 40 MG PO TABS: ORAL_TABLET | ORAL | Status: DC

## 2011-01-12 MED ORDER — OMEPRAZOLE 20 MG PO CPDR: 20.0000 mg | DELAYED_RELEASE_CAPSULE | Freq: Every day | ORAL | Status: DC

## 2011-01-12 MED ORDER — DOXAZOSIN MESYLATE 8 MG PO TABS: 8.0000 mg | ORAL_TABLET | Freq: Every day | ORAL | Status: DC

## 2011-01-12 MED ORDER — FLUNISOLIDE 29 MCG/ACT NA SOLN: 2.0000 | Freq: Two times a day (BID) | NASAL | Status: DC

## 2011-01-12 MED ORDER — ENALAPRIL MALEATE 20 MG PO TABS: 20.0000 mg | ORAL_TABLET | Freq: Every day | ORAL | Status: DC

## 2011-01-12 NOTE — Patient Instructions (Signed)
Continue your current medications follow-up in one year or sooner if any problems 

## 2011-01-12 NOTE — Progress Notes (Signed)
  Subjective:    Patient ID: [name], male    DOB: Apr 19, 1956, 55 y.o.   MRN: [id number]  HPI  [name] is a delightful, 55 year old, married male, nonsmoker, who comes in today for general physical examination because of a history of allergic rhinitis, hypertension, degenerative joint disease, left carotid bruit, allergic rhinitis, and venous insufficiency, secondary to bilateral knee replacements.  He takes Cardura 8 mg nightly for hypertension and BPH asymptomatic.  BP 110/80.  He also takes Vasotec 20 mg daily for hypertension.  He takes Motrin 800 mg daily for degenerative joint pain.  He also takes Lasix 40 mg daily for fluid retention, but he does have marked fluid retention despite taking the Lasix.  We discussed increasing the dose.  He takes Prilosec 20 mg daily for reflux esophagitis.  He stopped smoking.  He does have a history of a left carotid bruit.  He gets routine eye care, dental care, colonoscopy, 2009 normal, tetanus, 2009.    Review of Systems  Constitutional: Negative.   HENT: Negative.   Eyes: Negative.   Respiratory: Negative.   Cardiovascular: Negative.   Gastrointestinal: Negative.   Genitourinary: Negative.   Musculoskeletal: Negative.   Skin: Negative.   Neurological: Negative.   Hematological: Negative.   Psychiatric/Behavioral: Negative.        Objective:   Physical Exam  Constitutional: He is oriented to person, place, and time. He appears well-developed and well-nourished.  HENT:  Head: Normocephalic and atraumatic.  Right Ear: External ear normal.  Left Ear: External ear normal.  Nose: Nose normal.  Mouth/Throat: Oropharynx is clear and moist.       No carotid bruits  Eyes: Conjunctivae and EOM are normal. Pupils are equal, round, and reactive to light.  Neck: Normal range of motion. Neck supple. No JVD present. No tracheal deviation present. No thyromegaly present.  Cardiovascular: Normal rate, regular rhythm, normal heart  sounds and intact distal pulses.  Exam reveals no gallop and no friction rub.   No murmur heard. Pulmonary/Chest: Effort normal and breath sounds normal. No stridor. No respiratory distress. He has no wheezes. He has no rales. He exhibits no tenderness.  Abdominal: Soft. Bowel sounds are normal. He exhibits no distension and no mass. There is no tenderness. There is no rebound and no guarding.  Genitourinary: Rectum normal, prostate normal and penis normal. Guaiac negative stool. No penile tenderness.  Musculoskeletal: Normal range of motion. He exhibits edema. He exhibits no tenderness.  Lymphadenopathy:    He has no cervical adenopathy.  Neurological: He is alert and oriented to person, place, and time. He has normal reflexes. No cranial nerve deficit. He exhibits normal muscle tone.  Skin: Skin is warm and dry. No rash noted. No erythema. No pallor.  Psychiatric: He has a normal mood and affect. His behavior is normal. Judgment and thought content normal.          Assessment & Plan:  Healthy male.  Allergic rhinitis.  Continue the steroid nasal spray daily.  History of hypertension.  Continue Cardura 8 mg daily and Vasotec 20 mg daily.  History degenerative joint disease, smoking, 800 daily.  Venous insufficiency.  Increase Lasix to 40 mg b.i.d. Patient declines stockings.  Reflux esophagitis.  Continue Prilosec 20 mg daily.  Status post bilateral knee replacements

## 2011-01-16 ENCOUNTER — Other Ambulatory Visit: Payer: Self-pay | Admitting: Family Medicine

## 2011-01-16 DIAGNOSIS — M1990 Unspecified osteoarthritis, unspecified site: Secondary | ICD-10-CM

## 2011-01-18 MED ORDER — IBUPROFEN 800 MG PO TABS: 800.0000 mg | ORAL_TABLET | Freq: Two times a day (BID) | ORAL | Status: DC

## 2011-01-18 NOTE — Telephone Encounter (Signed)
Much and 800 mg, dispense 200 tabs directions one tab twice daily with food, refills x 3

## 2011-01-22 NOTE — Op Note (Signed)
[name], [name]               ACCOUNT NO.:  [id number]   MEDICAL RECORD NO.:  [id number]          PATIENT TYPE:  INP   LOCATION:  2899                         FACILITY:  [facility]   PHYSICIAN:  Elana Alm. Thurston Hole, M.D. DATE OF BIRTH:  04-11-1956   DATE OF PROCEDURE:  08/22/2006  DATE OF DISCHARGE:                               OPERATIVE REPORT   PREOPERATIVE DIAGNOSIS:  Right knee DJD.   POSTOPERATIVE DIAGNOSIS:  Right knee DJD.   PROCEDURE:  Right total knee replacement using DePuy cemented total knee  system with #5 cemented femur, #5 cemented tibia with 10 mm polyethylene  RP tibial spacer and 38 mm polyethylene cemented patella.   SURGEON:  Elana Alm. Thurston Hole, M.D.   ASSISTANT:  Julien Girt, P.A.   ANESTHESIA:  General.   OPERATIVE TIME:  1 hour 20 minutes.   COMPLICATIONS:  None.   DESCRIPTION OF PROCEDURE:  [name] was brought to operating room on  August 22, 2006 after a femoral nerve block was placed in holding room  by anesthesia.  He is placed operative table supine position.  He  received Ancef 2 grams IV preoperatively for prophylaxis.  After being  placed under general anesthesia his right knee was examined.  Range of  motion from -5 to 125 degrees with mild varus deformity, knee stable  ligamentous exam with normal patellar tracking.  He had a Foley catheter  placed under sterile conditions.  His right leg was prepped using  sterile DuraPrep and draped using sterile technique.  Leg was  exsanguinated and tourniquet elevated to a 375 mm.  Initially through a  15 cm longitudinal incision based over the patella, initial exposure was  made.  Underlying subcutaneous tissues were incised in line with skin  incision.  A median arthrotomy was performed revealing excessive amount  of normal-appearing joint fluid.  The articular surfaces were inspected.  He had grade 4 changes medially, grade 3 changes laterally, grade 3 to 4  changes in the patellofemoral  joint.  Osteophytes removed off femoral  condyles and tibial plateau.  The medial lateral meniscal remnants were  removed as well as the anterior cruciate ligament.  Intramedullary drill  was then drilled up femoral canal for placement distal femoral cutting  jig which was placed in the appropriate amount of rotation and the  distal 11 mm cut was made.  The distal femur was then sized.  #5 was  found be the appropriate size.  #5 cutting jig was placed and these cuts  were made.  Proximal tibia was exposed.  The tibial spines were removed  with an oscillating saw.  Intramedullary drill was drilled down the  tibial canal, placement of proximal tibial cutting jig which was placed  in the appropriate amount of rotation and proximal 6 mm cut was made  based off the medial lower side.  Spacer blocks were placed in flexion  extension.  10 mm blocks gave excellent stability and excellent  correction of his flexion and varus deformities.  After this was done,  #5 tibial base plate trial was placed on the cut tibial  surface and the  keel cut was made.  The PCL box cutter was placed on the distal femur  and these cuts were made.  At this point the #5 femoral trial was placed  and with the #5 tibial base plate trial and the 10 mm polyethylene RP  tibial spacer, the knee was reduced, taken through range of motion from  0 to 125 degrees with excellent stability and excellent correction of  his flexion and varus deformities.  The patella was sized.  A  resurfacing 10 mm cut was made and three locking holes placed for a 38  mm patella.  The patella trial was placed.  Patellofemoral tracking was  evaluated and found to be normal.  At this point it is felt that all  components were excellent size, fit and stability.  They were then  removed and the knee was then jet lavage irrigated with 3 liters of  saline.  The proximal tibia was exposed and a #5 tibial baseplate with  cement backing was hammered into  position with an excellent fit with  excess cement being removed from around the edges.  #5 femoral component  with cement backing was hammered into position also with an excellent  fit with excess cement being removed from around the edges.  A 10 mm  polyethylene RP tibial spacer was placed on tibial baseplate.  The knee  reduced, taken through range of motion from 0 to 125 degrees with  excellent stability and excellent correction of his flexion and varus  deformities.  The 38 mm polyethylene cement backed patella was then  placed in its position and held there with a clamp.  After the cement  hardened, patellofemoral tracking was again evaluated.  This was found  to be normal.  At this point it was felt that all components were  excellent size, fit and stability.  The wound was again irrigated with  saline.  Tourniquet was released.  Hemostasis was obtained with cautery.  The arthrotomy was then closed with #1 Ethibond suture over two medium  Hemovac drains.  Subcutaneous tissues closed with 0 and 2-0 Vicryl,  subcuticular layer closed with 4-0 Monocryl.  Steri-Strips were applied.  Sterile dressings were applied and a long-leg splint.  The patient  awakened, extubated, taken to recovery in stable condition.  Needle,  sponge counts correct x2 at end of the case.      Robert A. Thurston Hole, M.D.  Electronically Signed     RAW/MEDQ  D:  08/22/2006  T:  08/22/2006  Job:  [id number]

## 2011-01-22 NOTE — Discharge Summary (Signed)
[place]. [facility]  Patient:    [name], [name] Visit Number: [id number] MRN: [id number]          Service Type: SUR Location: 5000 [id number] Attending Physician:  Twana First Dictated by:   Julien Girt, P.A. Admit Date:  08/21/2001 Discharge Date: 08/25/2001                             Discharge Summary  ADMITTING DIAGNOSES: 1. End-stage degenerative joint disease left knee. 2. Hypertension.  DISCHARGE DIAGNOSES: 1. End-stage degenerative joint disease left knee. 2. Hypertension. 3. Urinary tract infection. 4. Postoperative blood loss anemia. 5. Urinary retention. 6. Renal insufficiency.  HISTORY OF PRESENT ILLNESS:  The patient is a 55 year old male who has a history of end-stage DJD of his left knee.  He has pain at night, pain with rest, unrelieved by p.o. pain medicines, Supartz injections, cortisone injections.  He understands the risks, benefits, and possible complications of a total knee and is without question.  PROCEDURES IN-HOUSE:  On August 21, 2001 patient underwent a left total knee.  He tolerated the procedure well.  Postoperative day #1, hemoglobin was 10.8.  His vital signs were stable.  He had a significant amount of blood loss from his knee.  He had difficulty with urinating.  He had an I&O catheterization.  Postoperative day #2, hemoglobin was 8.4, INR 1.6, BUN 41, creatinine 4.6, and he was given 500 cc of normal saline and his IV was increased to 125 cc/hour.  He was given 2 units of packed red blood cells and [facility] was consulted for management of his renal insufficiency.  Postoperative day #3, patients BUN was 28, creatinine 1.6, hemoglobin 8.9 in the morning.  At 6 p.m. hemoglobin was increased, therefore he was not transfused again.  He was started on Tequin 400 mg one p.o. q.d. for his UTI.  Postoperative day #4, hemoglobin was 9.5, INR 1.3.  Patient was urinating without  difficulty.  He was 0-60 on his CPM.  Surgical wound was well approximated, no excess drainage.  Distal neurovascular exam was intact. He was discharged to home.  MEDICATIONS: 1. Robaxin 500 mg one q.6-8h. p.r.n. spasm. 2. Tequin 400 mg one p.o. q.d. 3. Percocet one to two q.4-6h. p.r.n. pain. 4. Coumadin 5 mg one-and-a-half tablets q.d. 5. Colace 100 mg one p.o. b.i.d.  FOLLOW-UP:  He will follow up in the office on September 04, 2001 for x-rays and stitch removal.  ACTIVITY:  He is weightbearing as tolerated.  DIET:  Regular.  DISPOSITION:  He is discharged to home in stable condition. Dictated by:   Julien Girt, P.A. Attending Physician:  Twana First DD:  09/05/01 TD:  09/05/01 Job: [id number]

## 2011-01-22 NOTE — Discharge Summary (Signed)
NAMEJOVE, [name]               ACCOUNT NO.:  [id number]   MEDICAL RECORD NO.:  [id number]          PATIENT TYPE:  INP   LOCATION:  5037                         FACILITY:  [facility]   PHYSICIAN:  Elana Alm. Thurston Hole, M.D. DATE OF BIRTH:  [birth year]   DATE OF ADMISSION:  08/22/2006  DATE OF DISCHARGE:  08/24/2006                               DISCHARGE SUMMARY   ADMITTING DIAGNOSES:  1. End-stage degenerative joint disease, right knee.  2. Hypertension.  3. Benign prostatic hypertrophy.   DISCHARGE DIAGNOSES:  1. End-stage degenerative joint disease, right knee.  2. Hypertension.  3. Benign prostatic hypertrophy.  4. Hyponatremia.  5. Fever secondary to atelectasis.   HISTORY OF PRESENT ILLNESS:  The patient is a 55 year old black male  with a history of end-stage DJD of both knees.  He has had a left total  knee replacement in 2002 and did very well with this.  Now his right  knee bothers him most of the time.  He has failed conservative care  including anti-inflammatories, intra-articular cortisone shots, intra-  articular Supartz injections, and debriding arthroscopy.  He understands  the risks, benefits and possible complications of a total knee  replacement and is without question.   His previous total knee course was complicated by postop blood loss  anemia and a reaction to the morphine; therefore, he was given a  Dilaudid PCA postop on this course and also we elected to use an Autovac  on his drain so that he may retain some fluid from his knee drainage.   PROCEDURES IN HOUSE:  On August 22, 2006, the patient underwent a  right total knee replacement by Dr. Thurston Hole and a right femoral nerve  block by anesthesia.   He tolerated both procedures well, had significant output in his Autovac  in the recovery room, tolerated 0-60 on a CPM in the recovery room.  Despite his significant output in the back, he had a brisk 2+ dorsalis  pedis pulse and his Autovac was transfused.   Then a Hemovac was applied,  which was clamped for 4 hours and then charged.  Postop day #1  hemoglobin was 11.4, T-max of 101.1.  His lungs were clear.  He had  active bowel sounds.  His heart was at a regular rate and rhythm.  His  Foley was discontinued.  His his dressing was changed.  He was given  milk of magnesia to prevent constipation.  Physical therapy got him up.  Postop day #2 pain was under control, T-max of 101.5, hemoglobin was  10.5.  Renal function was excellent with a BUN of 9 and a creatinine of  1.0.  He was metabolically stable.  His wound was clean, intact, with a  small amount of serosanguineous drainage.  He tolerated CPM 0-60  degrees.  He had brisk capillary refill.  He had 2+ dorsalis pedis  pulses, active function of his EHL and FHL.  Sodium was slightly low at  134.  He was discharged to home on postop day #2 in stable condition.   DISCHARGE MEDICATIONS:  1. OxyIR #60, one to two  q.4-6h. p.r.n. pain.  2. Robaxin 500 mg one p.o. q.4-6h. p.r.n. muscle spasms.  3. Coumadin 5 mg 1-1/2 tablets daily.  4. Keflex 500 mg one p.o. q.i.d. for 10 days.  5. Colace 100 mg one tablet twice a day.  6. Enalapril 20 mg one tablet twice a day.  7. Hydrochlorothiazide 25 mg one tablet a day.  8. Doxazosin 8 mg one tablet a day.   He will follow up with Dr. Wyline Mood on August 31, 2006.  While at home  he will do CPM 0-60 degrees 8 hours a day, increasing by 5 degrees a day  until he reaches 90.  He will elevate his right heel on a folded pillow  for 30 minutes every morning.  He will receive home health physical  therapy as well as nursing to monitor his PT/INR is well with his wound.      [name], P.A.      Robert A. Thurston Hole, M.D.  Electronically Signed    KS/MEDQ  D:  10/06/2006  T:  10/06/2006  Job:  [id number]

## 2011-01-22 NOTE — Op Note (Signed)
[place]. [facility]  Patient:    [name], [name] Visit Number: [id number] MRN: [id number]          Service Type: SUR Location: RCRM 2550 01 Attending Physician:  Twana First Dictated by:   Elana Alm Thurston Hole, M.D. Proc. Date: 08/21/01 Admit Date:  08/21/2001                             Operative Report  PREOPERATIVE DIAGNOSIS:  Left knee degenerative joint disease.  POSTOPERATIVE DIAGNOSIS:  Left knee degenerative joint disease.  PROCEDURE:  Left total knee replacement using [employer] Scorpio total knee system with a #11 cemented femoral component, #11 cemented tibial component, with a 12 mm polyethylene tibial spacer, and 30 mm polyethylene cemented patella.  SURGEON:  Elana Alm. Thurston Hole, M.D.  ASSISTANT:  Julien Girt, P.A.  ANESTHESIA:  General.  OPERATIVE TIME:  One hour and 40 minutes.  COMPLICATIONS:  None.  DESCRIPTION OF PROCEDURE:  Mr. Gersten was brought to the operating room on August 21, 2001, and placed on the operating room table in the supine position.  After an adequate level of general anesthesia was obtained, his left knee was examined under anesthesia.  Range of motion from -10 to 105 degrees.  Moderate varus deformity.  The knee was stable to ligamentous exam. Had a Foley catheter placed under sterile conditions, and received Ancef 1 g IV preoperatively for prophylaxis.  His left leg was prepped using sterile Betadine and draped using a sterile technique.  His leg was exsanguinated and a thigh tourniquet elevated to 375 mmHg.  Initially through a 20 cm longitudinal patella incision, initial exposure was made.  The underlying subcutaneous tissues were incised in line with the skin incision.  A median arthrotomy was performed, revealing an excessive amount of normal-appearing joint fluid.  There were large loose bodies in the knee, three to four of these which were removed.  There were large hypertrophic osteophytes  on the femoral condyle, tibial plateau, and around the patella.  These were thoroughly removed.  Medially he was found to have grade 4 changes on the medial femoral condyle and medial tibial plateau, grade 2 and 3 changes on the lateral femoral condyle and lateral tibial plateau, and grade 3 and 4 changes in the patellofemoral joint.  The medial and lateral meniscal remnants were removed, as well as the anterior and posterior cruciate ligament remnants as well.  An intramedullary drill drill was then drilled up the femoral canal for the placement of the distal femoral cutting jig, which was placed in the appropriate amount of rotation, and a distal 12 mm cut was made.  The distal femur was incised.  A #11 was found to be the appropriate size.  A #11 cutting jig was placed, and then these cuts were made.  The proximal tibia was then exposed.  The tibial spines were removed with an oscillating saw.  The intramedullary drill was drilled down the tibial canal for the placement of the proximal tibial cutting jig, which was then placed in the appropriate amount of rotation, and a proximal tibial cut was made using 2 mm, based on the medial or lower side.  After this was done, the #11 tray was placed on the cut tibial surface and found to be an excellent fit.  The Scorpio PCL cutter was then placed back on the femoral component, and these cuts were made. After this was done, then the #11 femoral  trial was placed.  The #11 tibial base plate trial was placed with a 12 mm polyethylene insert, which gave excellent stability, restoration of normal alignment, range of motion 0-125 degrees, with no lift-off on the tray.  The tibial tray was then marked for rotation, and the keel cut was made.  After this was done, then the patella was sized.  A 30 mm was found to be the appropriate size, and a recessed 10 mm x 30 mm cut was made, and three locking holes placed.  After this was done, then it was felt that  all the trial components were of excellent size, fit, and stability.  The knee was jet lavaged, irrigated with 3 L of saline solution.  The proximal tibia was then exposed, and then the #11 tibial base plate was hammered into position, with an excellent fit, with excess cement being removed from around the edges.  The #11 femoral component was then hammered into position, also with an excellent fit, with excess cement being removed from around the edges as well.  The 12 mm polyethylene spacer was locked on the tibial base plate, and then this gave excellent stability, with range of motion 0-125 degrees, with no lift-off on the tray, and normal alignment.  The 30 mm patella button with cement backing was locked into its recessed hole as well, and held there with a clamp, until the cement hardened. After all the cement hardened, patella femoral tracking was evaluated.  There was very slight lateral tightness, and thus a small lateral retinacular release was carried out with cautery, thus improving patella tracking to normal.  At this point, it was felt that all the components were of excellent size, fit, and stability.  The knee was further irrigated with antibiotic solution, and then the arthrotomy was closed with #1 Ethibond suture over two medium Hemovac drains.  The subcutaneous tissues were closed with #0 and #2-0 Vicryl.  The skin was closed with skin staples.  Sterile dressings were applied.  Then the Hemovac was injected with 0.25% Marcaine with epinephrine and clamped.  The tourniquet was released.  A femoral nerve block was then placed by anesthesia for postoperative pain control.  The patient was then awakened and taken to the recovery room in stable condition.  The needle and sponge counts were correct x 2 at the end of the case. Dictated by:   Elana Alm Thurston Hole, M.D. Attending Physician:  Twana First DD:  08/21/01 TD:  08/21/01 Job: [id number]

## 2011-03-16 ENCOUNTER — Other Ambulatory Visit: Payer: Self-pay | Admitting: Family Medicine

## 2011-03-30 IMAGING — CR DG KNEE 1-2V PORT*L*
1 series · 1 of 1 positions shown · non-contrast
Comparison: None

CLINICAL DATA: Left knee arthroplasty

PORTABLE LEFT KNEE - 1-2 VIEW

[ap/obl knee]
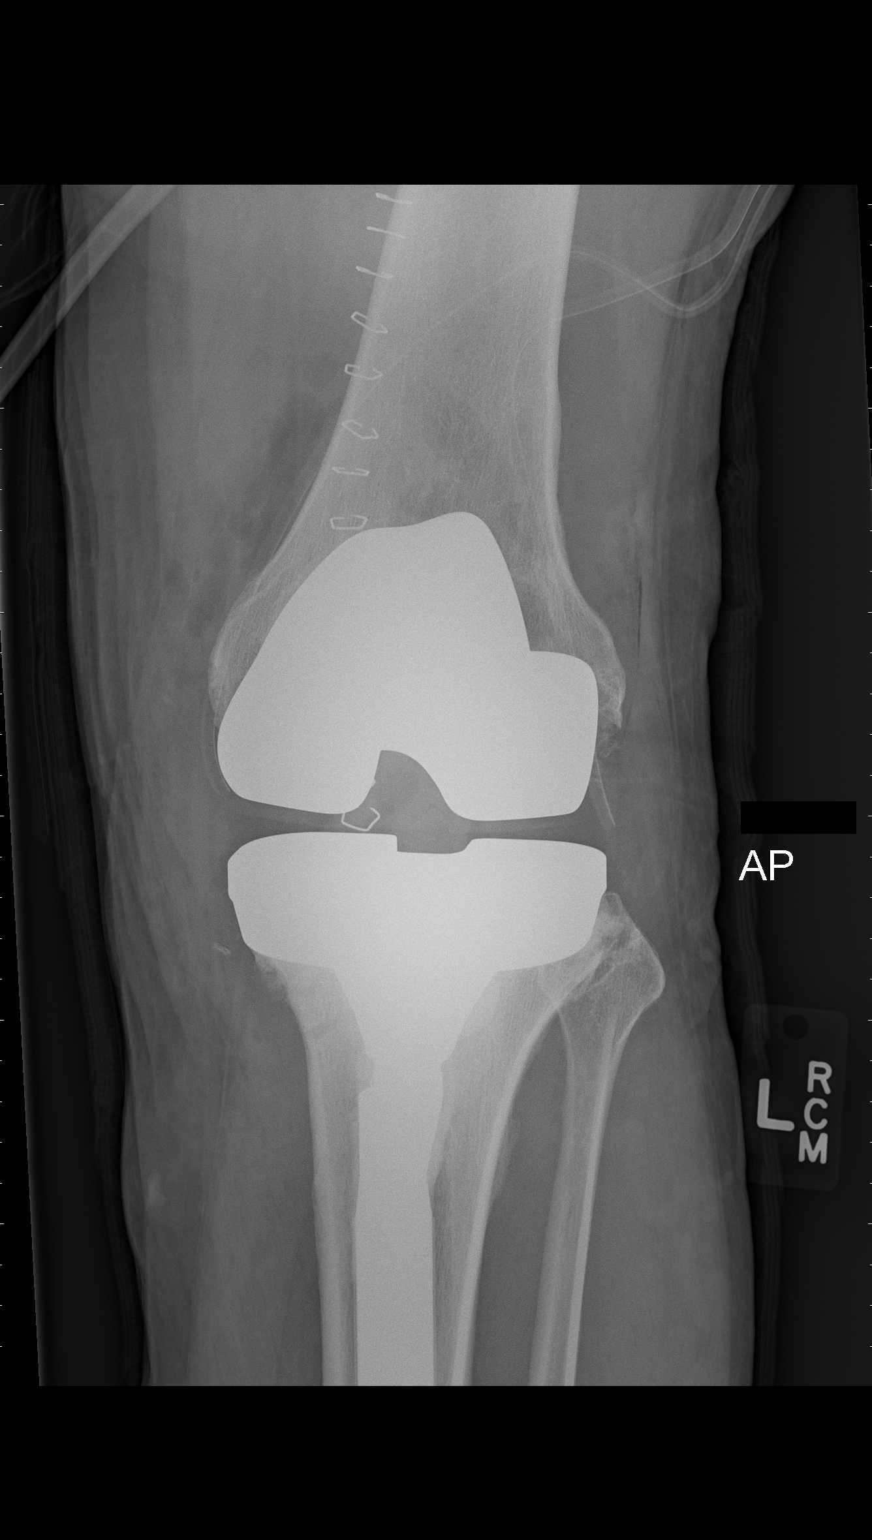

[1 of 1 positions shown; findings below may reference images not displayed]

FINDINGS: Left knee arthroplasty and with components in good
position.  Surgical drain in the suprapatellar location.  No
evidence of fracture.
IMPRESSION: No evidence of complication following arthroplasty.

## 2011-03-31 ENCOUNTER — Other Ambulatory Visit: Payer: Self-pay | Admitting: Family Medicine

## 2011-10-08 ENCOUNTER — Encounter: Payer: Self-pay | Admitting: Gastroenterology

## 2012-01-20 ENCOUNTER — Other Ambulatory Visit: Payer: Self-pay | Admitting: Family Medicine

## 2012-01-28 ENCOUNTER — Other Ambulatory Visit: Payer: Self-pay | Admitting: Family Medicine

## 2012-02-09 ENCOUNTER — Other Ambulatory Visit (INDEPENDENT_AMBULATORY_CARE_PROVIDER_SITE_OTHER): Payer: 59

## 2012-02-09 DIAGNOSIS — Z0000 Encounter for general adult medical examination without abnormal findings: Secondary | ICD-10-CM

## 2012-02-09 LAB — HEPATIC FUNCTION PANEL
ALT: 32 U/L (ref 0–53)
AST: 23 U/L (ref 0–37)
Alkaline Phosphatase: 65 U/L (ref 39–117)
Bilirubin, Direct: 0.1 mg/dL (ref 0.0–0.3)
Total Protein: 6.8 g/dL (ref 6.0–8.3)

## 2012-02-09 LAB — POCT URINALYSIS DIPSTICK
Bilirubin, UA: NEGATIVE
Glucose, UA: NEGATIVE
Leukocytes, UA: NEGATIVE
Nitrite, UA: NEGATIVE

## 2012-02-09 LAB — CBC WITH DIFFERENTIAL/PLATELET
Basophils Relative: 0.2 % (ref 0.0–3.0)
Eosinophils Relative: 2.9 % (ref 0.0–5.0)
MCV: 83 fl (ref 78.0–100.0)
Monocytes Absolute: 0.6 10*3/uL (ref 0.1–1.0)
Neutrophils Relative %: 52 % (ref 43.0–77.0)
RBC: 4.94 Mil/uL (ref 4.22–5.81)
WBC: 4.9 10*3/uL (ref 4.5–10.5)

## 2012-02-09 LAB — BASIC METABOLIC PANEL
Chloride: 109 mEq/L (ref 96–112)
Creatinine, Ser: 1 mg/dL (ref 0.4–1.5)
Potassium: 4.8 mEq/L (ref 3.5–5.1)

## 2012-02-09 LAB — LIPID PANEL
Total CHOL/HDL Ratio: 3
Triglycerides: 75 mg/dL (ref 0.0–149.0)

## 2012-02-09 LAB — PSA: PSA: 1.2 ng/mL (ref 0.10–4.00)

## 2012-02-09 LAB — TSH: TSH: 1.39 u[IU]/mL (ref 0.35–5.50)

## 2012-02-16 ENCOUNTER — Ambulatory Visit (INDEPENDENT_AMBULATORY_CARE_PROVIDER_SITE_OTHER): Payer: 59 | Admitting: Family Medicine

## 2012-02-16 ENCOUNTER — Encounter: Payer: Self-pay | Admitting: Family Medicine

## 2012-02-16 VITALS — BP 130/90 | Temp 98.4°F | Ht 76.75 in | Wt 267.0 lb

## 2012-02-16 DIAGNOSIS — N529 Male erectile dysfunction, unspecified: Secondary | ICD-10-CM | POA: Insufficient documentation

## 2012-02-16 DIAGNOSIS — R0989 Other specified symptoms and signs involving the circulatory and respiratory systems: Secondary | ICD-10-CM

## 2012-02-16 DIAGNOSIS — Z0000 Encounter for general adult medical examination without abnormal findings: Secondary | ICD-10-CM

## 2012-02-16 DIAGNOSIS — K219 Gastro-esophageal reflux disease without esophagitis: Secondary | ICD-10-CM

## 2012-02-16 DIAGNOSIS — J309 Allergic rhinitis, unspecified: Secondary | ICD-10-CM

## 2012-02-16 DIAGNOSIS — I10 Essential (primary) hypertension: Secondary | ICD-10-CM

## 2012-02-16 MED ORDER — FUROSEMIDE 40 MG PO TABS: 40.0000 mg | ORAL_TABLET | Freq: Two times a day (BID) | ORAL | Status: DC

## 2012-02-16 MED ORDER — OMEPRAZOLE 20 MG PO CPDR
20.0000 mg | DELAYED_RELEASE_CAPSULE | Freq: Every day | ORAL | Status: DC
Start: 2012-02-16 — End: 2014-06-24

## 2012-02-16 MED ORDER — SILDENAFIL CITRATE 50 MG PO TABS: 50.0000 mg | ORAL_TABLET | ORAL | Status: DC | PRN

## 2012-02-16 MED ORDER — DOXAZOSIN MESYLATE 8 MG PO TABS: 8.0000 mg | ORAL_TABLET | Freq: Every day | ORAL | Status: DC

## 2012-02-16 MED ORDER — ENALAPRIL MALEATE 20 MG PO TABS: 20.0000 mg | ORAL_TABLET | Freq: Every day | ORAL | Status: DC

## 2012-02-16 MED ORDER — FLUNISOLIDE 25 MCG/ACT (0.025%) NA SOLN: NASAL | Status: DC

## 2012-02-16 NOTE — Progress Notes (Signed)
  Subjective:    Patient ID: [name], male    DOB: Jul 09, 1956, 56 y.o.   MRN: [id number]  HPI [name] is a 56 year old married male nonsmoker who comes in today for general physical examination because of a history of degenerative joint disease bilateral knee replacements, hypertension, allergic rhinitis, reflux esophagitis with a history of an esophageal stricture  Medications reviewed there is no changes except that he takes Lasix 40 mg twice a day and not hydrochlorothiazide  He gets routine eye care, dental care,,,,,,,,,,,,, braces times one year plus because of malocclusion,,,,,,,, tetanus booster 2000 9 GI followup as needed   Review of Systems  Constitutional: Negative.   HENT: Negative.   Eyes: Negative.   Respiratory: Negative.   Cardiovascular: Negative.   Gastrointestinal: Negative.   Genitourinary: Negative.   Musculoskeletal: Negative.   Skin: Negative.   Neurological: Negative.   Hematological: Negative.   Psychiatric/Behavioral: Negative.        Objective:   Physical Exam  Constitutional: He is oriented to person, place, and time. He appears well-developed and well-nourished.  HENT:  Head: Normocephalic and atraumatic.  Right Ear: External ear normal.  Left Ear: External ear normal.  Nose: Nose normal.  Mouth/Throat: Oropharynx is clear and moist.  Eyes: Conjunctivae and EOM are normal. Pupils are equal, round, and reactive to light.  Neck: Normal range of motion. Neck supple. No JVD present. No tracheal deviation present. No thyromegaly present.  Cardiovascular: Normal rate, regular rhythm, normal heart sounds and intact distal pulses.  Exam reveals no gallop and no friction rub.   No murmur heard. Pulmonary/Chest: Effort normal and breath sounds normal. No stridor. No respiratory distress. He has no wheezes. He has no rales. He exhibits no tenderness.  Abdominal: Soft. Bowel sounds are normal. He exhibits no distension and no mass. There is no  tenderness. There is no rebound and no guarding.  Genitourinary: Rectum normal, prostate normal and penis normal. Guaiac negative stool. No penile tenderness.  Musculoskeletal: Normal range of motion. He exhibits no edema and no tenderness.  Lymphadenopathy:    He has no cervical adenopathy.  Neurological: He is alert and oriented to person, place, and time. He has normal reflexes. No cranial nerve deficit. He exhibits normal muscle tone.  Skin: Skin is warm and dry. No rash noted. No erythema. No pallor.  Psychiatric: He has a normal mood and affect. His behavior is normal. Judgment and thought content normal.          Assessment & Plan:  Healthy male  Hypertension continue current meds  Reflux esophagitis continue current medications  Status post total knee replacement followup by orthopedics  Allergic rhinitis continue OTC Allegra and steroid nasal spray when necessary  Erectile dysfunction add back her 50 mg when necessary,,,,,,

## 2012-02-16 NOTE — Patient Instructions (Signed)
Continue current medications  Viagra 50 mg,,,,,,,,,,,,,,,, directions one half tab 2 hours prior to sex with water  Return in one year sooner if any problems  Call your orthopedist about the pain you're having and see him

## 2012-03-28 ENCOUNTER — Other Ambulatory Visit: Payer: Self-pay | Admitting: Family Medicine

## 2012-05-22 ENCOUNTER — Ambulatory Visit (INDEPENDENT_AMBULATORY_CARE_PROVIDER_SITE_OTHER): Payer: 59 | Admitting: Family Medicine

## 2012-05-22 ENCOUNTER — Encounter: Payer: Self-pay | Admitting: Family Medicine

## 2012-05-22 VITALS — BP 140/90 | Temp 98.5°F | Wt 268.0 lb

## 2012-05-22 DIAGNOSIS — R300 Dysuria: Secondary | ICD-10-CM

## 2012-05-22 DIAGNOSIS — N410 Acute prostatitis: Secondary | ICD-10-CM | POA: Insufficient documentation

## 2012-05-22 LAB — POCT URINALYSIS DIPSTICK
Bilirubin, UA: NEGATIVE
Glucose, UA: NEGATIVE
Nitrite, UA: NEGATIVE
Spec Grav, UA: 1.01

## 2012-05-22 MED ORDER — CIPROFLOXACIN HCL 250 MG PO TABS: ORAL_TABLET | ORAL | Status: DC

## 2012-05-22 NOTE — Patient Instructions (Signed)
Take the Cipro 1 twice daily pill bottle empty return when necessary

## 2012-05-22 NOTE — Progress Notes (Signed)
  Subjective:    Patient ID: [name], male    DOB: 1956/01/06, 56 y.o.   MRN: [id number]  HPI  [name] is a 56 year old married male nonsmoker who comes in today with a ten-day history of fever chills and urinary tract symptoms  He's never had prostate infections before.  Review of Systems    general and urinary tract review of systems otherwise negative Objective:   Physical Exam Well-developed well-nourished male in no acute distress he is afebrile genital exam normal urinalysis shows moderate white cells some protein small amount of blood       Assessment & Plan:

## 2012-05-26 ENCOUNTER — Ambulatory Visit (INDEPENDENT_AMBULATORY_CARE_PROVIDER_SITE_OTHER): Payer: 59 | Admitting: Internal Medicine

## 2012-05-26 ENCOUNTER — Encounter: Payer: Self-pay | Admitting: Internal Medicine

## 2012-05-26 ENCOUNTER — Telehealth: Payer: Self-pay | Admitting: Family Medicine

## 2012-05-26 VITALS — BP 126/80 | Temp 98.1°F | Wt 265.0 lb

## 2012-05-26 DIAGNOSIS — N410 Acute prostatitis: Secondary | ICD-10-CM

## 2012-05-26 DIAGNOSIS — Z87438 Personal history of other diseases of male genital organs: Secondary | ICD-10-CM

## 2012-05-26 DIAGNOSIS — Z87898 Personal history of other specified conditions: Secondary | ICD-10-CM

## 2012-05-26 LAB — POCT URINALYSIS DIPSTICK
Glucose, UA: NEGATIVE
Ketones, UA: NEGATIVE
Spec Grav, UA: 1.025
Urobilinogen, UA: 0.2

## 2012-05-26 NOTE — Telephone Encounter (Signed)
Caller: [name]/Patient; Patient Name: [name]; PCP: Kelle Darting [facility]); Best Callback Phone Number: [phone], currently on antibiotic for prostate infection, was seen in office 05/22/12, better, but having significant urinary frequency at night and throughout the day Urinary Sx Protocol Utilized.  Appt scheduled 05/26/12 1015.

## 2012-05-26 NOTE — Patient Instructions (Signed)
Take your antibiotic as prescribed until ALL of it is gone, but stop if you develop a rash, swelling, or any side effects of the medication.  Contact our office as soon as possible if  there are side effects of the medication.  Call or return to clinic prn if these symptoms worsen or fail to improve as anticipated.  

## 2012-05-26 NOTE — Progress Notes (Signed)
  Subjective:    Patient ID: [name], male    DOB: 1955-10-19, 56 y.o.   MRN: [id number]  HPI  56 year old patient who was seen 4 days ago and treated for probable prostatitis. He is on Cipro twice daily he continues to have urinary frequency but has steadily improved each day he's had no further fever or chills. He does have some persistent mild dysuria. Urinalysis revealed some persistent pyuria.     Review of Systems  Genitourinary: Positive for dysuria and frequency.       Objective:   Physical Exam  Constitutional: He appears well-developed and well-nourished. No distress.       Temperature 98.1          Assessment & Plan:  History of prostatitis. Now afebrile. Urinary frequency and dysuria slowly improving. We'll continue Cipro since there is improvement. We'll call if there is any clinical deterioration

## 2012-06-16 ENCOUNTER — Other Ambulatory Visit: Payer: Self-pay | Admitting: Family Medicine

## 2012-07-14 ENCOUNTER — Encounter: Payer: Self-pay | Admitting: Gastroenterology

## 2012-09-06 HISTORY — PX: KNEE ARTHROPLASTY: SHX992

## 2013-03-05 ENCOUNTER — Other Ambulatory Visit: Payer: Self-pay | Admitting: Family Medicine

## 2013-03-30 ENCOUNTER — Other Ambulatory Visit: Payer: Self-pay | Admitting: Family Medicine

## 2013-04-11 ENCOUNTER — Other Ambulatory Visit: Payer: 59

## 2013-04-24 ENCOUNTER — Encounter: Payer: 59 | Admitting: Family Medicine

## 2013-05-21 ENCOUNTER — Other Ambulatory Visit: Payer: Self-pay | Admitting: Family Medicine

## 2013-06-25 ENCOUNTER — Other Ambulatory Visit: Payer: Self-pay | Admitting: Family Medicine

## 2013-06-26 ENCOUNTER — Other Ambulatory Visit (INDEPENDENT_AMBULATORY_CARE_PROVIDER_SITE_OTHER): Payer: 59

## 2013-06-26 ENCOUNTER — Other Ambulatory Visit: Payer: 59

## 2013-06-26 DIAGNOSIS — Z0000 Encounter for general adult medical examination without abnormal findings: Secondary | ICD-10-CM

## 2013-06-26 LAB — POCT URINALYSIS DIPSTICK
Blood, UA: NEGATIVE
Nitrite, UA: NEGATIVE
Spec Grav, UA: 1.015
Urobilinogen, UA: 0.2

## 2013-06-26 LAB — CBC WITH DIFFERENTIAL/PLATELET
Basophils Absolute: 0 10*3/uL (ref 0.0–0.1)
Eosinophils Relative: 4.4 % (ref 0.0–5.0)
HCT: 39.4 % (ref 39.0–52.0)
Hemoglobin: 12.8 g/dL — ABNORMAL LOW (ref 13.0–17.0)
Lymphs Abs: 1.5 10*3/uL (ref 0.7–4.0)
MCHC: 32.5 g/dL (ref 30.0–36.0)
MCV: 82.4 fl (ref 78.0–100.0)
Monocytes Absolute: 0.5 10*3/uL (ref 0.1–1.0)
Neutro Abs: 3.3 10*3/uL (ref 1.4–7.7)
Platelets: 216 10*3/uL (ref 150.0–400.0)
RDW: 14.7 % — ABNORMAL HIGH (ref 11.5–14.6)

## 2013-06-26 LAB — BASIC METABOLIC PANEL
BUN: 13 mg/dL (ref 6–23)
CO2: 29 mEq/L (ref 19–32)
Calcium: 9.4 mg/dL (ref 8.4–10.5)
Creatinine, Ser: 0.9 mg/dL (ref 0.4–1.5)
GFR: 110.48 mL/min (ref >60.00)
Glucose, Bld: 96 mg/dL (ref 70–99)
Sodium: 141 mEq/L (ref 135–145)

## 2013-06-26 LAB — HEPATIC FUNCTION PANEL
AST: 25 U/L (ref 0–37)
Albumin: 3.7 g/dL (ref 3.5–5.2)
Alkaline Phosphatase: 66 U/L (ref 39–117)
Bilirubin, Direct: 0.1 mg/dL (ref 0.0–0.3)
Total Protein: 7 g/dL (ref 6.0–8.3)

## 2013-06-26 LAB — LIPID PANEL
LDL Cholesterol: 91 mg/dL (ref 0–99)
Triglycerides: 60 mg/dL (ref 0.0–149.0)
VLDL: 12 mg/dL (ref 0.0–40.0)

## 2013-06-26 LAB — PSA: PSA: 1.32 ng/mL (ref 0.10–4.00)

## 2013-06-26 LAB — TSH: TSH: 1.34 u[IU]/mL (ref 0.35–5.50)

## 2013-06-29 ENCOUNTER — Other Ambulatory Visit: Payer: Self-pay | Admitting: Family Medicine

## 2013-07-03 ENCOUNTER — Encounter: Payer: Self-pay | Admitting: Family Medicine

## 2013-07-03 ENCOUNTER — Ambulatory Visit (INDEPENDENT_AMBULATORY_CARE_PROVIDER_SITE_OTHER): Payer: 59 | Admitting: Family Medicine

## 2013-07-03 VITALS — BP 140/90 | Temp 98.2°F | Ht 78.0 in | Wt 250.0 lb

## 2013-07-03 DIAGNOSIS — K222 Esophageal obstruction: Secondary | ICD-10-CM

## 2013-07-03 DIAGNOSIS — M1990 Unspecified osteoarthritis, unspecified site: Secondary | ICD-10-CM

## 2013-07-03 DIAGNOSIS — K219 Gastro-esophageal reflux disease without esophagitis: Secondary | ICD-10-CM

## 2013-07-03 DIAGNOSIS — I10 Essential (primary) hypertension: Secondary | ICD-10-CM

## 2013-07-03 DIAGNOSIS — K5730 Diverticulosis of large intestine without perforation or abscess without bleeding: Secondary | ICD-10-CM

## 2013-07-03 DIAGNOSIS — J309 Allergic rhinitis, unspecified: Secondary | ICD-10-CM

## 2013-07-03 DIAGNOSIS — N529 Male erectile dysfunction, unspecified: Secondary | ICD-10-CM

## 2013-07-03 MED ORDER — ENALAPRIL MALEATE 20 MG PO TABS: ORAL_TABLET | ORAL | Status: DC

## 2013-07-03 MED ORDER — FLUNISOLIDE 25 MCG/ACT (0.025%) NA SOLN: NASAL | Status: DC

## 2013-07-03 MED ORDER — FUROSEMIDE 40 MG PO TABS: ORAL_TABLET | ORAL | Status: DC

## 2013-07-03 MED ORDER — DOXAZOSIN MESYLATE 8 MG PO TABS: ORAL_TABLET | ORAL | Status: DC

## 2013-07-03 MED ORDER — SILDENAFIL CITRATE 50 MG PO TABS: 50.0000 mg | ORAL_TABLET | ORAL | Status: DC | PRN

## 2013-07-03 NOTE — Patient Instructions (Signed)
Continue your current medications  Follow-up in 1 year sooner if any problem 

## 2013-07-03 NOTE — Progress Notes (Signed)
  Subjective:    Patient ID: [name], male    DOB: [birth year], 57 y.o.   MRN: [id number]  HPI [name] is a 57 year old male who comes in today for general physical examination because of a history of hypertension, allergic rhinitis, reflux esophagitis and history of esophageal stricture treated by Dr. Arlyce Dice, erectile dysfunction, colon polyps  His medication reviewed the been essentially no changes. He says he feels well he has no major complaints.  Vaccinations are up to date   Review of Systems  Constitutional: Negative.   HENT: Negative.   Eyes: Negative.   Respiratory: Negative.   Cardiovascular: Negative.   Gastrointestinal: Negative.   Endocrine: Negative.   Genitourinary: Negative.   Musculoskeletal: Negative.   Skin: Negative.   Allergic/Immunologic: Negative.   Neurological: Negative.   Hematological: Negative.   Psychiatric/Behavioral: Negative.        Objective:   Physical Exam  Nursing note and vitals reviewed. Constitutional: He is oriented to person, place, and time. He appears well-developed and well-nourished.  HENT:  Head: Normocephalic and atraumatic.  Right Ear: External ear normal.  Left Ear: External ear normal.  Nose: Nose normal.  Mouth/Throat: Oropharynx is clear and moist.  Eyes: Conjunctivae and EOM are normal. Pupils are equal, round, and reactive to light.  Neck: Normal range of motion. Neck supple. No JVD present. No tracheal deviation present. No thyromegaly present.  Cardiovascular: Normal rate, regular rhythm, normal heart sounds and intact distal pulses.  Exam reveals no gallop and no friction rub.   No murmur heard.  No carotid nor aortic bruits peripheral pulses 2+ and symmetrical  Pulmonary/Chest: Effort normal and breath sounds normal. No stridor. No respiratory distress. He has no wheezes. He has no rales. He exhibits no tenderness.  Abdominal: Soft. Bowel sounds are normal. He exhibits no distension and no mass. There is no  tenderness. There is no rebound and no guarding.  Genitourinary: Rectum normal, prostate normal and penis normal. Guaiac negative stool. No penile tenderness.  Musculoskeletal: Normal range of motion. He exhibits no edema and no tenderness.  Scars right and left knee from previous total knee replacement. The left was done twice  Lymphadenopathy:    He has no cervical adenopathy.  Neurological: He is alert and oriented to person, place, and time. He has normal reflexes. No cranial nerve deficit. He exhibits normal muscle tone.  One plus peripheral edema bilaterally  Skin: Skin is warm and dry. No rash noted. No erythema. No pallor.  Psychiatric: He has a normal mood and affect. His behavior is normal. Judgment and thought content normal.          Assessment & Plan:  Healthy male  Hypertension at goal continue current therapy  Allergic rhinitis continue current therapy  Reflux esophagitis with a history of a stricture continue Prilosec daily  Erectile dysfunction refill Viagra  Status post total knee replacements....... left x2

## 2013-08-20 ENCOUNTER — Encounter: Payer: Self-pay | Admitting: Gastroenterology

## 2013-09-13 ENCOUNTER — Other Ambulatory Visit: Payer: Self-pay | Admitting: Family Medicine

## 2013-09-17 ENCOUNTER — Telehealth: Payer: Self-pay | Admitting: *Deleted

## 2013-09-17 NOTE — Telephone Encounter (Signed)
2018

## 2013-09-17 NOTE — Telephone Encounter (Signed)
After reviewing patient's chart for pre-visit, unsure if colonoscopy is due now or in 2018. Patient had colonoscopy with you on 11/07/2006, had polyps removed (not adenomatous) also had colon biopsies done. Patient had recall of 5 years, then under letters states recall changed to March 2018. Please review, colonoscopy due now or 2018? Thanks

## 2013-09-17 NOTE — Telephone Encounter (Signed)
Spoke with patient. Made patient aware of Dr.Kaplan's request for his next colonoscopy due March 2018. Patient denies any GI problems and denies any family HX colon cancer.  Also explained that his appointments will be cancelled and he will get reminder letter in mail for 2018. Patient verbally understands.

## 2013-10-02 ENCOUNTER — Encounter: Payer: 59 | Admitting: Gastroenterology

## 2014-03-19 ENCOUNTER — Other Ambulatory Visit: Payer: Self-pay | Admitting: Family Medicine

## 2014-03-26 ENCOUNTER — Other Ambulatory Visit: Payer: Self-pay | Admitting: *Deleted

## 2014-03-26 NOTE — Telephone Encounter (Signed)
Patient is requesting a written prescription of viagra 100mg 

## 2014-03-27 MED ORDER — SILDENAFIL CITRATE 100 MG PO TABS: 50.0000 mg | ORAL_TABLET | Freq: Every day | ORAL | Status: DC | PRN

## 2014-03-27 NOTE — Telephone Encounter (Signed)
Rx ready for pick up and patient is aware 

## 2014-06-17 ENCOUNTER — Other Ambulatory Visit (INDEPENDENT_AMBULATORY_CARE_PROVIDER_SITE_OTHER): Payer: 59

## 2014-06-17 DIAGNOSIS — Z0000 Encounter for general adult medical examination without abnormal findings: Secondary | ICD-10-CM

## 2014-06-17 LAB — POCT URINALYSIS DIPSTICK
Bilirubin, UA: NEGATIVE
Blood, UA: NEGATIVE
Glucose, UA: NEGATIVE
Leukocytes, UA: NEGATIVE
Nitrite, UA: NEGATIVE
SPEC GRAV UA: 1.02
Urobilinogen, UA: 1
pH, UA: 7

## 2014-06-17 LAB — CBC WITH DIFFERENTIAL/PLATELET
BASOS PCT: 0.5 % (ref 0.0–3.0)
Basophils Absolute: 0 10*3/uL (ref 0.0–0.1)
Eosinophils Absolute: 0.3 10*3/uL (ref 0.0–0.7)
Eosinophils Relative: 5 % (ref 0.0–5.0)
HCT: 40.6 % (ref 39.0–52.0)
HEMOGLOBIN: 13.1 g/dL (ref 13.0–17.0)
LYMPHS PCT: 31.8 % (ref 12.0–46.0)
Lymphs Abs: 1.6 10*3/uL (ref 0.7–4.0)
MCHC: 32.2 g/dL (ref 30.0–36.0)
MCV: 82.9 fl (ref 78.0–100.0)
MONOS PCT: 9.4 % (ref 3.0–12.0)
Monocytes Absolute: 0.5 10*3/uL (ref 0.1–1.0)
Neutro Abs: 2.7 10*3/uL (ref 1.4–7.7)
Neutrophils Relative %: 53.3 % (ref 43.0–77.0)
Platelets: 234 10*3/uL (ref 150.0–400.0)
RBC: 4.9 Mil/uL (ref 4.22–5.81)
RDW: 14.4 % (ref 11.5–15.5)
WBC: 5 10*3/uL (ref 4.0–10.5)

## 2014-06-17 LAB — TSH: TSH: 2.37 u[IU]/mL (ref 0.35–4.50)

## 2014-06-17 LAB — LIPID PANEL
CHOL/HDL RATIO: 4
CHOLESTEROL: 147 mg/dL (ref 0–200)
HDL: 34 mg/dL — ABNORMAL LOW (ref >39.00)
LDL CALC: 95 mg/dL (ref 0–99)
NonHDL: 113
Triglycerides: 89 mg/dL (ref 0.0–149.0)
VLDL: 17.8 mg/dL (ref 0.0–40.0)

## 2014-06-17 LAB — BASIC METABOLIC PANEL
BUN: 14 mg/dL (ref 6–23)
CALCIUM: 9.2 mg/dL (ref 8.4–10.5)
CO2: 30 meq/L (ref 19–32)
Chloride: 105 mEq/L (ref 96–112)
Creatinine, Ser: 1.1 mg/dL (ref 0.4–1.5)
GFR: 87.54 mL/min (ref >60.00)
Glucose, Bld: 94 mg/dL (ref 70–99)
Potassium: 5.3 mEq/L — ABNORMAL HIGH (ref 3.5–5.1)
SODIUM: 140 meq/L (ref 135–145)

## 2014-06-17 LAB — HEPATIC FUNCTION PANEL
ALK PHOS: 63 U/L (ref 39–117)
ALT: 30 U/L (ref 0–53)
AST: 29 U/L (ref 0–37)
Albumin: 3.3 g/dL — ABNORMAL LOW (ref 3.5–5.2)
BILIRUBIN DIRECT: 0.1 mg/dL (ref 0.0–0.3)
TOTAL PROTEIN: 7.2 g/dL (ref 6.0–8.3)
Total Bilirubin: 0.7 mg/dL (ref 0.2–1.2)

## 2014-06-17 LAB — PSA: PSA: 1.29 ng/mL (ref 0.10–4.00)

## 2014-06-24 ENCOUNTER — Ambulatory Visit (INDEPENDENT_AMBULATORY_CARE_PROVIDER_SITE_OTHER): Payer: 59 | Admitting: Family Medicine

## 2014-06-24 ENCOUNTER — Encounter: Payer: Self-pay | Admitting: Family Medicine

## 2014-06-24 VITALS — BP 110/80 | Temp 98.1°F | Ht 76.5 in | Wt 260.0 lb

## 2014-06-24 DIAGNOSIS — K2100 Gastro-esophageal reflux disease with esophagitis, without bleeding: Secondary | ICD-10-CM

## 2014-06-24 DIAGNOSIS — N521 Erectile dysfunction due to diseases classified elsewhere: Secondary | ICD-10-CM

## 2014-06-24 DIAGNOSIS — R0989 Other specified symptoms and signs involving the circulatory and respiratory systems: Secondary | ICD-10-CM

## 2014-06-24 DIAGNOSIS — K222 Esophageal obstruction: Secondary | ICD-10-CM

## 2014-06-24 DIAGNOSIS — I10 Essential (primary) hypertension: Secondary | ICD-10-CM

## 2014-06-24 MED ORDER — OMEPRAZOLE 20 MG PO CPDR: 20.0000 mg | DELAYED_RELEASE_CAPSULE | Freq: Every day | ORAL | Status: DC

## 2014-06-24 MED ORDER — DOXAZOSIN MESYLATE 8 MG PO TABS: ORAL_TABLET | ORAL | Status: DC

## 2014-06-24 MED ORDER — ENALAPRIL MALEATE 20 MG PO TABS: ORAL_TABLET | ORAL | Status: DC

## 2014-06-24 MED ORDER — FUROSEMIDE 40 MG PO TABS: ORAL_TABLET | ORAL | Status: DC

## 2014-06-24 MED ORDER — SILDENAFIL CITRATE 100 MG PO TABS: 50.0000 mg | ORAL_TABLET | Freq: Every day | ORAL | Status: DC | PRN

## 2014-06-24 NOTE — Patient Instructions (Signed)
Continue current medications  Started diet and exercise program,,,,,,,,,,,, goal is to lose 15 pounds in the next 12 months  Return in one year sooner if any problems

## 2014-06-24 NOTE — Progress Notes (Signed)
   Subjective:    Patient ID: [name], male    DOB: 22-Nov-1955, 58 y.o.   MRN: [id number]  HPI [name] is a 58 year old male who comes in today for evaluation of allergic rhinitis, erectile dysfunction, hypertension, esophageal reflux with history of stricture, overweight.......... 260 pounds........ and a left carotid bruit  He says he feels well and has no complaints  He gets routine eye care, dental care, colonoscopy originally showed a polyp ,,,,,,,, he was told to come back in 5 years and then they called back and said 10 years.   Review of Systems  Constitutional: Negative.   HENT: Negative.   Eyes: Negative.   Respiratory: Negative.   Cardiovascular: Negative.   Gastrointestinal: Negative.   Endocrine: Negative.   Genitourinary: Negative.   Musculoskeletal: Negative.   Skin: Negative.   Allergic/Immunologic: Negative.   Neurological: Negative.   Hematological: Negative.   Psychiatric/Behavioral: Negative.        Objective:   Physical Exam  Nursing note and vitals reviewed. Constitutional: He is oriented to person, place, and time. He appears well-developed and well-nourished.  HENT:  Head: Normocephalic and atraumatic.  Right Ear: External ear normal.  Left Ear: External ear normal.  Nose: Nose normal.  Mouth/Throat: Oropharynx is clear and moist.  Eyes: Conjunctivae and EOM are normal. Pupils are equal, round, and reactive to light.  Neck: Normal range of motion. Neck supple. No JVD present. No tracheal deviation present. No thyromegaly present.  Cardiovascular: Normal rate, regular rhythm, normal heart sounds and intact distal pulses.  Exam reveals no gallop and no friction rub.   No murmur heard. Pulmonary/Chest: Effort normal and breath sounds normal. No stridor. No respiratory distress. He has no wheezes. He has no rales. He exhibits no tenderness.  Abdominal: Soft. Bowel sounds are normal. He exhibits no distension and no mass. There is no tenderness.  There is no rebound and no guarding.  Genitourinary: Rectum normal and penis normal. Guaiac negative stool. No penile tenderness.  Musculoskeletal: Normal range of motion. He exhibits no edema and no tenderness.  Lymphadenopathy:    He has no cervical adenopathy.  Neurological: He is alert and oriented to person, place, and time. He has normal reflexes. No cranial nerve deficit. He exhibits normal muscle tone.  Skin: Skin is warm and dry. No rash noted. No erythema. No pallor.  Total body skin exam normal except for scars right and left knee from previous total knee rectal a smooth is  Psychiatric: He has a normal mood and affect. His behavior is normal. Judgment and thought content normal.          Assessment & Plan:  Healthy male  ,,,,, Overweight,,,,,,,,,,,, discussed diet exercise and weight loss  Hypertension at goal continue current therapy  Allergic rhinitis,,,,,,,, continue OTC Allegra  History affect recti dysfunction,,,,,,,,,,,,, Viagra when necessary  History of esophageal stricture,,,,,,,,,,, continue omeprazole daily  Status post total knee replacement,,

## 2014-06-24 NOTE — Progress Notes (Signed)
Pre visit review using our clinic review tool, if applicable. No additional management support is needed unless otherwise documented below in the visit note. 

## 2014-06-25 ENCOUNTER — Telehealth: Payer: Self-pay | Admitting: Family Medicine

## 2014-06-25 NOTE — Telephone Encounter (Signed)
emmi emailed °

## 2014-09-06 HISTORY — PX: REVISION TOTAL KNEE ARTHROPLASTY: SUR1280

## 2015-04-16 ENCOUNTER — Telehealth: Payer: Self-pay | Admitting: Family Medicine

## 2015-04-16 DIAGNOSIS — N521 Erectile dysfunction due to diseases classified elsewhere: Secondary | ICD-10-CM

## 2015-04-16 NOTE — Telephone Encounter (Signed)
Pt request refill of the following:  sildenafil (VIAGRA) 100 MG tablet   Pt need a paper for the above medicine . Pt said he mails this off   Phamacy:

## 2015-04-17 MED ORDER — SILDENAFIL CITRATE 100 MG PO TABS: 50.0000 mg | ORAL_TABLET | Freq: Every day | ORAL | Status: DC | PRN

## 2015-04-17 NOTE — Telephone Encounter (Signed)
rx ready for pickup 

## 2015-07-23 ENCOUNTER — Other Ambulatory Visit: Payer: Self-pay | Admitting: Family Medicine

## 2015-09-09 ENCOUNTER — Other Ambulatory Visit: Payer: Self-pay | Admitting: Family Medicine

## 2015-09-09 ENCOUNTER — Telehealth: Payer: Self-pay | Admitting: Family Medicine

## 2015-09-09 DIAGNOSIS — I10 Essential (primary) hypertension: Secondary | ICD-10-CM

## 2015-09-09 MED ORDER — ENALAPRIL MALEATE 20 MG PO TABS: ORAL_TABLET | ORAL | Status: DC

## 2015-09-09 MED ORDER — FUROSEMIDE 40 MG PO TABS: ORAL_TABLET | ORAL | Status: DC

## 2015-09-09 MED ORDER — DOXAZOSIN MESYLATE 8 MG PO TABS: ORAL_TABLET | ORAL | Status: DC

## 2015-09-09 NOTE — Telephone Encounter (Signed)
Pt has a cpx sch for mar 2017. Pt needs #90 EACH furosemide, generic cardura and enalepril [facility] [address]

## 2015-09-09 NOTE — Telephone Encounter (Signed)
Rx sent 

## 2015-09-15 ENCOUNTER — Other Ambulatory Visit: Payer: Self-pay | Admitting: Family Medicine

## 2015-09-18 ENCOUNTER — Other Ambulatory Visit: Payer: Self-pay | Admitting: Family Medicine

## 2015-11-26 ENCOUNTER — Other Ambulatory Visit (INDEPENDENT_AMBULATORY_CARE_PROVIDER_SITE_OTHER): Payer: Self-pay

## 2015-11-26 DIAGNOSIS — Z0000 Encounter for general adult medical examination without abnormal findings: Secondary | ICD-10-CM

## 2015-11-26 LAB — POC URINALSYSI DIPSTICK (AUTOMATED)
BILIRUBIN UA: NEGATIVE
Blood, UA: NEGATIVE
GLUCOSE UA: NEGATIVE
Ketones, UA: NEGATIVE
LEUKOCYTES UA: NEGATIVE
NITRITE UA: NEGATIVE
PH UA: 6.5
Spec Grav, UA: >=1.03
Urobilinogen, UA: 1

## 2015-11-26 LAB — CBC WITH DIFFERENTIAL/PLATELET
Basophils Absolute: 0 10*3/uL (ref 0.0–0.1)
Basophils Relative: 0.3 % (ref 0.0–3.0)
EOS ABS: 0.2 10*3/uL (ref 0.0–0.7)
Eosinophils Relative: 4 % (ref 0.0–5.0)
HEMATOCRIT: 38.6 % — AB (ref 39.0–52.0)
HEMOGLOBIN: 12.6 g/dL — AB (ref 13.0–17.0)
LYMPHS PCT: 31.2 % (ref 12.0–46.0)
Lymphs Abs: 1.3 10*3/uL (ref 0.7–4.0)
MCHC: 32.6 g/dL (ref 30.0–36.0)
MCV: 79.8 fl (ref 78.0–100.0)
MONO ABS: 0.4 10*3/uL (ref 0.1–1.0)
Monocytes Relative: 9.5 % (ref 3.0–12.0)
Neutro Abs: 2.3 10*3/uL (ref 1.4–7.7)
Neutrophils Relative %: 55 % (ref 43.0–77.0)
Platelets: 257 10*3/uL (ref 150.0–400.0)
RBC: 4.84 Mil/uL (ref 4.22–5.81)
RDW: 15.1 % (ref 11.5–15.5)
WBC: 4.3 10*3/uL (ref 4.0–10.5)

## 2015-11-26 LAB — TSH: TSH: 1.78 u[IU]/mL (ref 0.35–4.50)

## 2015-11-26 LAB — BASIC METABOLIC PANEL
BUN: 13 mg/dL (ref 6–23)
CO2: 31 mEq/L (ref 19–32)
CREATININE: 0.98 mg/dL (ref 0.40–1.50)
Calcium: 9.2 mg/dL (ref 8.4–10.5)
Chloride: 106 mEq/L (ref 96–112)
GFR: 100.57 mL/min (ref >60.00)
Glucose, Bld: 99 mg/dL (ref 70–99)
POTASSIUM: 4.4 meq/L (ref 3.5–5.1)
Sodium: 142 mEq/L (ref 135–145)

## 2015-11-26 LAB — HEPATIC FUNCTION PANEL
ALK PHOS: 66 U/L (ref 39–117)
ALT: 36 U/L (ref 0–53)
AST: 28 U/L (ref 0–37)
Albumin: 3.7 g/dL (ref 3.5–5.2)
BILIRUBIN DIRECT: 0.1 mg/dL (ref 0.0–0.3)
TOTAL PROTEIN: 7 g/dL (ref 6.0–8.3)
Total Bilirubin: 0.4 mg/dL (ref 0.2–1.2)

## 2015-11-26 LAB — LIPID PANEL
Cholesterol: 137 mg/dL (ref 0–200)
HDL: 39.4 mg/dL (ref >39.00)
LDL CALC: 85 mg/dL (ref 0–99)
NonHDL: 97.27
Total CHOL/HDL Ratio: 3
Triglycerides: 63 mg/dL (ref 0.0–149.0)
VLDL: 12.6 mg/dL (ref 0.0–40.0)

## 2015-11-26 LAB — PSA: PSA: 1.38 ng/mL (ref 0.10–4.00)

## 2015-12-03 ENCOUNTER — Ambulatory Visit (INDEPENDENT_AMBULATORY_CARE_PROVIDER_SITE_OTHER): Payer: Commercial Managed Care - HMO | Admitting: Family Medicine

## 2015-12-03 ENCOUNTER — Encounter: Payer: Self-pay | Admitting: Family Medicine

## 2015-12-03 VITALS — BP 140/90 | Temp 98.8°F | Ht 77.0 in | Wt 261.0 lb

## 2015-12-03 DIAGNOSIS — K219 Gastro-esophageal reflux disease without esophagitis: Secondary | ICD-10-CM | POA: Diagnosis not present

## 2015-12-03 DIAGNOSIS — K222 Esophageal obstruction: Secondary | ICD-10-CM

## 2015-12-03 DIAGNOSIS — M172 Bilateral post-traumatic osteoarthritis of knee: Secondary | ICD-10-CM

## 2015-12-03 DIAGNOSIS — I10 Essential (primary) hypertension: Secondary | ICD-10-CM | POA: Diagnosis not present

## 2015-12-03 DIAGNOSIS — Z0000 Encounter for general adult medical examination without abnormal findings: Secondary | ICD-10-CM

## 2015-12-03 DIAGNOSIS — N521 Erectile dysfunction due to diseases classified elsewhere: Secondary | ICD-10-CM

## 2015-12-03 MED ORDER — ENALAPRIL MALEATE 20 MG PO TABS
ORAL_TABLET | ORAL | Status: DC
Start: 2015-12-03 — End: 2016-12-13

## 2015-12-03 MED ORDER — DOXAZOSIN MESYLATE 8 MG PO TABS: ORAL_TABLET | ORAL | Status: DC

## 2015-12-03 MED ORDER — FUROSEMIDE 40 MG PO TABS: ORAL_TABLET | ORAL | Status: DC

## 2015-12-03 MED ORDER — SILDENAFIL CITRATE 20 MG PO TABS: ORAL_TABLET | ORAL | Status: DC

## 2015-12-03 MED ORDER — SILDENAFIL CITRATE 100 MG PO TABS: 50.0000 mg | ORAL_TABLET | Freq: Every day | ORAL | Status: DC | PRN

## 2015-12-03 MED ORDER — OMEPRAZOLE 20 MG PO CPDR: DELAYED_RELEASE_CAPSULE | ORAL | Status: DC

## 2015-12-03 NOTE — Progress Notes (Signed)
Pre visit review using our clinic review tool, if applicable. No additional management support is needed unless otherwise documented below in the visit note. 

## 2015-12-03 NOTE — Progress Notes (Signed)
   Subjective:    Patient ID: [name], male    DOB: [birth year], 60 y.o.   MRN: [id number]  HPI [name] is a 60 year old male nonsmoker who comes in today for general physical examination  He has a history of hypertension and takes Cardura 8 mg, Vasotec 20 mg, and Lasix 40 mg twice a day. Blood pressure today 140/90 his blood pressure typically runs 130/80. He drank an energy drink before coming to the office.  He's had bilateral total knee replacements and takes Motrin 600 mg twice a day  He's had a history of reflux esophagitis and takes Prilosec 20 mg daily  He uses Viagra when necessary for ED  He gets routine eye care, dental care, colonoscopy 2010 normal  Vaccinations up-to-date tetanus booster 2009  Social history he works for the city of [place]   Review of Systems  Constitutional: Negative.   HENT: Negative.   Eyes: Negative.   Respiratory: Negative.   Cardiovascular: Negative.   Gastrointestinal: Negative.   Endocrine: Negative.   Genitourinary: Negative.   Musculoskeletal: Negative.   Skin: Negative.   Allergic/Immunologic: Negative.   Neurological: Negative.   Hematological: Negative.   Psychiatric/Behavioral: Negative.        Objective:   Physical Exam  Constitutional: He is oriented to person, place, and time. He appears well-developed and well-nourished.  HENT:  Head: Normocephalic and atraumatic.  Right Ear: External ear normal.  Left Ear: External ear normal.  Nose: Nose normal.  Mouth/Throat: Oropharynx is clear and moist.  Eyes: Conjunctivae and EOM are normal. Pupils are equal, round, and reactive to light.  Neck: Normal range of motion. Neck supple. No JVD present. No tracheal deviation present. No thyromegaly present.  Cardiovascular: Normal rate, regular rhythm, normal heart sounds and intact distal pulses.  Exam reveals no gallop and no friction rub.   No murmur heard. No carotid nor aortic bruits peripheral pulses 2+ and  symmetrical  Pulmonary/Chest: Effort normal and breath sounds normal. No stridor. No respiratory distress. He has no wheezes. He has no rales. He exhibits no tenderness.  Abdominal: Soft. Bowel sounds are normal. He exhibits no distension and no mass. There is no tenderness. There is no rebound and no guarding.  Genitourinary: Rectum normal and penis normal. Guaiac negative stool. No penile tenderness.  1+ symmetrical nonnodular BPH  Musculoskeletal: Normal range of motion. He exhibits no edema or tenderness.  Lymphadenopathy:    He has no cervical adenopathy.  Neurological: He is alert and oriented to person, place, and time. He has normal reflexes. He displays normal reflexes. No cranial nerve deficit. He exhibits normal muscle tone. Coordination normal.  Skin: Skin is warm and dry. No rash noted. No erythema. No pallor.  Total body skin exam normal except for scars anterior right left knee from previous total knee replacements  Psychiatric: He has a normal mood and affect. His behavior is normal. Judgment and thought content normal.  Nursing note and vitals reviewed.         Assessment & Plan:  Hypertension at goal....... continue current therapy  Osteoarthritis status post right left total knee replacement.......[name]. Motrin 600 mg twice a day  Chronic reflux esophagitis...[name].[name].[name]. Prilosec 20 mg daily  Erectile dysfunction.......[name]. refill generic Viagra

## 2015-12-03 NOTE — Patient Instructions (Signed)
Continue current medications  Generic Viagra 20 mg......... [facility]  Return in one year for general physical exam sooner if any problem..........  [name] or Donald FanningJulie are 2 new adult nurse practitioner's or Dr. [place],,,,,,,, call in November to get set up for your physical examination the end of March 2018 with one of the new folks and he get established for long-term care

## 2016-08-09 ENCOUNTER — Other Ambulatory Visit: Payer: Self-pay

## 2016-08-09 DIAGNOSIS — I10 Essential (primary) hypertension: Secondary | ICD-10-CM

## 2016-08-09 MED ORDER — DOXAZOSIN MESYLATE 8 MG PO TABS: ORAL_TABLET | ORAL | 1 refills | Status: DC

## 2016-09-29 ENCOUNTER — Encounter: Payer: Self-pay | Admitting: Gastroenterology

## 2016-11-08 ENCOUNTER — Ambulatory Visit: Payer: Commercial Managed Care - HMO

## 2016-11-08 ENCOUNTER — Ambulatory Visit (INDEPENDENT_AMBULATORY_CARE_PROVIDER_SITE_OTHER): Payer: Commercial Managed Care - HMO | Admitting: Family Medicine

## 2016-11-08 ENCOUNTER — Telehealth: Payer: Self-pay | Admitting: Family Medicine

## 2016-11-08 ENCOUNTER — Encounter: Payer: Self-pay | Admitting: Gastroenterology

## 2016-11-08 ENCOUNTER — Encounter: Payer: Self-pay | Admitting: Family Medicine

## 2016-11-08 VITALS — BP 128/80 | HR 74 | Temp 97.8°F | Wt 161.0 lb

## 2016-11-08 VITALS — Ht 77.0 in | Wt 260.4 lb

## 2016-11-08 DIAGNOSIS — N3090 Cystitis, unspecified without hematuria: Secondary | ICD-10-CM | POA: Diagnosis not present

## 2016-11-08 DIAGNOSIS — R3915 Urgency of urination: Secondary | ICD-10-CM

## 2016-11-08 DIAGNOSIS — N521 Erectile dysfunction due to diseases classified elsewhere: Secondary | ICD-10-CM

## 2016-11-08 DIAGNOSIS — Z86010 Personal history of colonic polyps: Secondary | ICD-10-CM

## 2016-11-08 LAB — POC URINALSYSI DIPSTICK (AUTOMATED)
Bilirubin, UA: NEGATIVE
GLUCOSE UA: NEGATIVE
Ketones, UA: NEGATIVE
NITRITE UA: POSITIVE
Spec Grav, UA: 1.025
UROBILINOGEN UA: 0.2
pH, UA: 5

## 2016-11-08 MED ORDER — SILDENAFIL CITRATE 100 MG PO TABS: 50.0000 mg | ORAL_TABLET | Freq: Every day | ORAL | 11 refills | Status: DC | PRN

## 2016-11-08 MED ORDER — SULFAMETHOXAZOLE-TRIMETHOPRIM 800-160 MG PO TABS: 1.0000 | ORAL_TABLET | Freq: Two times a day (BID) | ORAL | 0 refills | Status: DC

## 2016-11-08 MED ORDER — NA SULFATE-K SULFATE-MG SULF 17.5-3.13-1.6 GM/177ML PO SOLN: 1.0000 | Freq: Once | ORAL | 0 refills | Status: AC

## 2016-11-08 MED ORDER — SILDENAFIL CITRATE 100 MG PO TABS: 50.0000 mg | ORAL_TABLET | Freq: Every day | ORAL | 0 refills | Status: DC | PRN

## 2016-11-08 NOTE — Addendum Note (Signed)
Addended by: Bonnye FavaKWEI, NANA K on: 11/08/2016 10:53 AM   Modules accepted: Orders

## 2016-11-08 NOTE — Addendum Note (Signed)
Addended by: Raj JanusADKINS, MISTY T on: 11/08/2016 03:31 PM   Modules accepted: Orders

## 2016-11-08 NOTE — Progress Notes (Signed)
Patient ID: [name], male   DOB: 05/14/1956, 61 y.o.   MRN: [id number]    PCP: Joycelyn Man, MD  Subjective:  [name] is a 61 y.o. year old very pleasant male patient who presents with Urinary Tract symptoms: symptoms including dysuria,and urgency. He reports that urgency and frequency have improved. -started: 2 weeks ago, symptoms are improving but remain prsent -previous treatments: AZO has provided moderate benefit for symptoms  No history of prostate infections.  Last UTI noted approximately 4 years ago. No new sexual partners, one male. No discharge from penis.   ROS-denies fever, chills, sweats, N/V, flank pain, or blood in urine  Pertinent Past Medical History-  HTN  Medications- reviewed  Current Outpatient Prescriptions  Medication Sig Dispense Refill  . aspirin 81 MG tablet Take 81 mg by mouth daily.      [name] doxazosin (CARDURA) 8 MG tablet TAKE 1 TABLET (8 MG TOTAL) BY MOUTH AT BEDTIME. 90 tablet 1  . enalapril (VASOTEC) 20 MG tablet TAKE 1 TABLET (20 MG TOTAL) BY MOUTH DAILY. 90 tablet 3  . furosemide (LASIX) 40 MG tablet TAKE 1 TABLET (40 MG TOTAL) BY MOUTH 2 (TWO) TIMES DAILY. 200 tablet 3  . ibuprofen (ADVIL,MOTRIN) 800 MG tablet Take 1 tablet (800 mg total) by mouth 2 (two) times daily. One tablet daily 200 tablet 3  . Multiple Vitamin (MULTIVITAMIN) tablet Take 1 tablet by mouth daily.    . Na Sulfate-K Sulfate-Mg Sulf 17.5-3.13-1.6 GM/180ML SOLN Take 1 kit by mouth once. Dispense as written. No substitutions. 354 mL 0  . omeprazole (PRILOSEC) 20 MG capsule TAKE 1 CAPSULE (20 MG TOTAL) BY MOUTH DAILY. 100 capsule 3  . sildenafil (VIAGRA) 100 MG tablet Take 0.5-1 tablets (50-100 mg total) by mouth daily as needed for erectile dysfunction. 10 tablet 11   No current facility-administered medications for this visit.     Objective: BP 128/80 (BP Location: Left Arm, Patient Position: Sitting, Cuff Size: Large)   Pulse 74   Temp 97.8 F (36.6 C)  (Oral)   Wt 161 lb (73 kg)   SpO2 96%   BMI 19.09 kg/m  Gen: NAD, resting comfortably CV: RRR no murmurs rubs or gallops Lungs: CTAB no crackles, wheeze, rhonchi Abdomen: soft/nontender/nondistended/normal bowel sounds. No rebound or guarding.  No CVA tenderness.   No suprapubic tenderness Ext: no edema Skin: warm, dry, no rash Neuro: grossly normal, moves all extremities  Assessment/Plan:  1. Cystitis UA:  2+ Leukocytes and + nitrites. History and exam are suggestive of UTI.  Will treat with Bactrim and send for culture. - sulfamethoxazole-trimethoprim (BACTRIM DS,SEPTRA DS) 800-160 MG tablet; Take 1 tablet by mouth 2 (two) times daily.  Dispense: 14 tablet; Refill: 0  2. Urinary urgency  - POCT Urinalysis Dipstick (Automated)   Advised patient to complete antibiotic and je should follow up if his symptoms do not improve in 2 to 3 days, worsen, he develops a fever >101, or back pain. Also advised her to force fluids. Patient voiced understanding and agreed with plan.   Finally, we reviewed reasons to return to care including if symptoms worsen or persist or new concerns arise- once again particularly fever, N/V, or flank pain.   Laurita Quint, FNP

## 2016-11-08 NOTE — Progress Notes (Signed)
Denies allergies to eggs or soy products. Denies complication of anesthesia or sedation. Denies use of weight loss medication. Denies use of O2.   Emmi instructions declined.  

## 2016-11-08 NOTE — Telephone Encounter (Signed)
Tried to print X 2.  Unable to send to printer.

## 2016-11-08 NOTE — Telephone Encounter (Signed)
Error/ltd ° °

## 2016-11-08 NOTE — Telephone Encounter (Signed)
Per Dr. Tawanna Coolerodd, ok to fill for one year.  Pt has upcoming cpx.

## 2016-11-08 NOTE — Patient Instructions (Addendum)
Please complete antibiotic as directed. If you develop symptoms of fever >101, pain in your back, or do not improve with treatment that has been provided in 3 to 4 days, please follow up for further evaluation and treatment. Please drink plenty of water, enough to keep your urine pale yellow or clear.  WE NOW OFFER   [name] FAST TRACK!!!  SAME DAY Appointments for ACUTE CARE  Such as: Sprains, Injuries, cuts, abrasions, rashes, muscle pain, joint pain, back pain Colds, flu, sore throats, headache, allergies, cough, fever  Ear pain, sinus and eye infections Abdominal pain, nausea, vomiting, diarrhea, upset stomach Animal/insect bites  3 Easy Ways to Schedule: Walk-In Scheduling Call in scheduling Mychart Sign-up: [email or web address]

## 2016-11-08 NOTE — Telephone Encounter (Signed)
Pt notified to pick up at the front desk. 

## 2016-11-08 NOTE — Progress Notes (Signed)
Pre visit review using our clinic review tool, if applicable. No additional management support is needed unless otherwise documented below in the visit note. 

## 2016-11-08 NOTE — Telephone Encounter (Signed)
Patient needs a paper prescription for his Viagra.  [place] to [place] so that is why he needs it printed out.

## 2016-11-11 LAB — URINE CULTURE

## 2016-11-22 ENCOUNTER — Encounter: Payer: Self-pay | Admitting: Gastroenterology

## 2016-11-22 ENCOUNTER — Telehealth: Payer: Self-pay | Admitting: *Deleted

## 2016-11-22 ENCOUNTER — Ambulatory Visit (AMBULATORY_SURGERY_CENTER): Payer: Commercial Managed Care - HMO | Admitting: Gastroenterology

## 2016-11-22 VITALS — BP 104/60 | HR 89 | Temp 96.2°F | Resp 17 | Ht 77.0 in | Wt 260.0 lb

## 2016-11-22 DIAGNOSIS — Z86010 Personal history of colonic polyps: Secondary | ICD-10-CM

## 2016-11-22 DIAGNOSIS — K639 Disease of intestine, unspecified: Secondary | ICD-10-CM | POA: Diagnosis not present

## 2016-11-22 MED ORDER — SODIUM CHLORIDE 0.9 % IV SOLN: 500.0000 mL | INTRAVENOUS | Status: DC

## 2016-11-22 MED ORDER — NON FORMULARY
Freq: Once | Status: AC
  Administered 2016-11-22: 17:00:00 via ORAL

## 2016-11-22 NOTE — Progress Notes (Signed)
Pt. Reports no change in his medical or surgical history since pre-visit.  

## 2016-11-22 NOTE — Progress Notes (Signed)
Pt had been evaluated by Dr. Lavon PaganiniNandigam before discharge.  Still c/o abd pain et tenderness to right lower abd, states, "about the same as when I came in."  Simethicone 10 mL po given per D.O.  Pt agitated and keeps saying, "I'll be fine once I get home."  Dr. Lavon PaganiniNandigam ordered for pt to have OTC IBgard 1-2 tablets po TID PRN.  Pt continues to have pain and Dr. Lavon PaganiniNandigam is aware.  She feels pt is ok to go home- all he needs to do is pass air.  She oks pt to be discharged.  Pt discharged at 1549- he knows to call back if he continues to have pain.

## 2016-11-22 NOTE — Progress Notes (Signed)
Abdomen was soft, no tenderness, tympanic in the RLQ. He felt the cramps and pain was improving prior to discharge. Encouraged patient to pass air and advance diet slowly as tolerated. Call with any change in symptoms/ or worsening of pain.  Iona BeardK. Veena Nandigam , MD [phone] Mon-Fri 8a-5p [phone] after 5p, weekends, holidays

## 2016-11-22 NOTE — Progress Notes (Signed)
A/ox3 pleased with MAC, report to Kristen RN 

## 2016-11-22 NOTE — Op Note (Signed)
[name] Patient Name: [name] Procedure Date: 11/22/2016 1:27 PM MRN: [id number] Endoscopist: Napoleon FormKavitha V. Anglea Gordner , MD Age: 61 Referring MD:  Date of Birth: [birth year] Gender: Male Account #: [id number] Procedure:                Colonoscopy Indications:              Screening for colorectal malignant neoplasm Medicines:                Monitored Anesthesia Care Procedure:                Pre-Anesthesia Assessment:                           - Prior to the procedure, a History and Physical                            was performed, and patient medications and                            allergies were reviewed. The patient's tolerance of                            previous anesthesia was also reviewed. The risks                            and benefits of the procedure and the sedation                            options and risks were discussed with the patient.                            All questions were answered, and informed consent                            was obtained. Prior Anticoagulants: The patient has                            taken no previous anticoagulant or antiplatelet                            agents. ASA Grade Assessment: II - A patient with                            mild systemic disease. After reviewing the risks                            and benefits, the patient was deemed in                            satisfactory condition to undergo the procedure.                           After obtaining informed consent, the colonoscope  was passed under direct vision. Throughout the                            procedure, the patient's blood pressure, pulse, and                            oxygen saturations were monitored continuously. The                            Colonoscope was introduced through the anus and                            advanced to the the cecum, identified by                            appendiceal orifice and  ileocecal valve. The                            Colonoscope was introduced through the and advanced                            to the. The colonoscopy was somewhat difficult due                            to multiple diverticula in the colon, inadequate                            bowel prep, poor endoscopic visualization and                            restricted mobility of the colon. The patient                            tolerated the procedure well. The quality of the                            bowel preparation was excellent. The ileocecal                            valve, appendiceal orifice, and rectum were                            photographed. Scope In: 1:40:39 PM Scope Out: 2:33:22 PM Scope Withdrawal Time: 0 hours 21 minutes 18 seconds  Total Procedure Duration: 0 hours 52 minutes 43 seconds  Findings:                 The perianal and digital rectal examinations were                            normal.                           Multiple small and large-mouthed diverticula were  found in the entire colon. There was narrowing of                            the colon in association with the diverticular                            opening. There was evidence of diverticular spasm.                            Peri-diverticular erythema was seen. There was                            evidence of an impacted diverticulum.                           A segmental area of granular mucosa thickened fold                            peridiverticular was found in the sigmoid colon.                            Biopsies were taken with a cold forceps for                            histology. Complications:            No immediate complications. Estimated Blood Loss:     Estimated blood loss was minimal. Impression:               - Severe diverticulosis in the entire examined                            colon. There was narrowing of the colon in                             association with the diverticular opening. There                            was evidence of diverticular spasm.                            Peri-diverticular erythema was seen. There was                            evidence of an impacted diverticulum.                           - Granularity in the sigmoid colon. Biopsied. Recommendation:           - Patient has a contact number available for                            emergencies. The signs and symptoms of potential  delayed complications were discussed with the                            patient. Return to normal activities tomorrow.                            Written discharge instructions were provided to the                            patient.                           - Resume previous diet.                           - Continue present medications.                           - Await pathology results.                           - Repeat colonoscopy date to be determined after                            pending pathology results are reviewed for                            surveillance based on pathology results.                           - Return to GI clinic PRN. Napoleon Form, MD 11/22/2016 2:45:24 PM This report has been signed electronically.

## 2016-11-22 NOTE — Telephone Encounter (Signed)
Attempted to reach pt to check on him.  No answer, no message left.

## 2016-11-22 NOTE — Patient Instructions (Addendum)
YOU HAD AN ENDOSCOPIC PROCEDURE TODAY AT THE [facility]:   Refer to the procedure report that was given to you for any specific questions about what was found during the examination.  If the procedure report does not answer your questions, please call your gastroenterologist to clarify.  If you requested that your care partner not be given the details of your procedure findings, then the procedure report has been included in a sealed envelope for you to review at your convenience later.  YOU SHOULD EXPECT: Some feelings of bloating in the abdomen. Passage of more gas than usual.  Walking can help get rid of the air that was put into your GI tract during the procedure and reduce the bloating. If you had a lower endoscopy (such as a colonoscopy or flexible sigmoidoscopy) you may notice spotting of blood in your stool or on the toilet paper. If you underwent a bowel prep for your procedure, you may not have a normal bowel movement for a few days.  Please Note:  You might notice some irritation and congestion in your nose or some drainage.  This is from the oxygen used during your procedure.  There is no need for concern and it should clear up in a day or so.  SYMPTOMS TO REPORT IMMEDIATELY:   Following lower endoscopy (colonoscopy or flexible sigmoidoscopy):  Excessive amounts of blood in the stool  Significant tenderness or worsening of abdominal pains  Swelling of the abdomen that is new, acute  Fever of 100F or higher For urgent or emergent issues, a gastroenterologist can be reached at any hour by calling [phone].  DIET:  Soft diet today! And tomorrow as needed. Drink plenty of fluids but you should avoid alcoholic beverages for 24 hours.  ACTIVITY:  You should plan to take it easy for the rest of today and you should NOT DRIVE or use heavy machinery until tomorrow (because of the sedation medicines used during the test).    FOLLOW UP: Our staff will call the number listed  on your records the next business day following your procedure to check on you and address any questions or concerns that you may have regarding the information given to you following your procedure. If we do not reach you, we will leave a message.  However, if you are feeling well and you are not experiencing any problems, there is no need to return our call.  We will assume that you have returned to your regular daily activities without incident.  If any biopsies were taken you will be contacted by phone or by letter within the next 1-3 weeks.  Please call us at [phone] if you have not heard about the biopsies in 3 weeks.   SIGNATURES/CONFIDENTIALITY: You and/or your care partner have signed paperwork which will be entered into your electronic medical record.  These signatures attest to the fact that that the information above on your After Visit Summary has been reviewed and is understood.  Full responsibility of the confidentiality of this discharge information lies with you and/or your care-partner.  Await pathology  Please continue your normal medications  Please read over handout about diverticulosis and high fiber diets  May take IBgard 1-2 tablets three times daily as needed- you may get this over there counter

## 2016-11-22 NOTE — Progress Notes (Signed)
Pt has had abdominal pain since arrival to [facility].  Levsin and Simethicone given per D.O.-see flowsheet.  Pt has been turned to right, left, stomach, on hands and knees.  Has only passed air once.  Ambulated twice around nurse's station and sitting on toilet in attempt to pass air

## 2016-11-22 NOTE — Progress Notes (Signed)
Called to room to assist during endoscopic procedure.  Patient ID and intended procedure confirmed with present staff. Received instructions for my participation in the procedure from the performing physician.  

## 2016-11-23 ENCOUNTER — Telehealth: Payer: Self-pay

## 2016-11-23 NOTE — Telephone Encounter (Signed)
  Follow up Call-  Call back number 11/22/2016  Post procedure Call Back phone  # [phone]  Permission to leave phone message Yes  Some recent data might be hidden     Patient questions:  Do you have a fever, pain , or abdominal swelling? No. Pain Score  0 *  Have you tolerated food without any problems? Yes.    Have you been able to return to your normal activities? Yes.    Do you have any questions about your discharge instructions: Diet   No. Medications  No. Follow up visit  No.  Do you have questions or concerns about your Care? No.  Actions: * If pain score is 4 or above: No action needed, pain <4.

## 2016-11-26 ENCOUNTER — Encounter: Payer: Self-pay | Admitting: Gastroenterology

## 2016-11-30 ENCOUNTER — Other Ambulatory Visit (INDEPENDENT_AMBULATORY_CARE_PROVIDER_SITE_OTHER): Payer: Commercial Managed Care - HMO

## 2016-11-30 DIAGNOSIS — Z0000 Encounter for general adult medical examination without abnormal findings: Secondary | ICD-10-CM

## 2016-11-30 LAB — POC URINALSYSI DIPSTICK (AUTOMATED)
Bilirubin, UA: NEGATIVE
GLUCOSE UA: NEGATIVE
Ketones, UA: NEGATIVE
Leukocytes, UA: NEGATIVE
NITRITE UA: NEGATIVE
PROTEIN UA: NEGATIVE
RBC UA: NEGATIVE
SPEC GRAV UA: 1.03 (ref 1.030–1.035)
UROBILINOGEN UA: 0.2 (ref ?–2.0)
pH, UA: 6 (ref 5.0–8.0)

## 2016-11-30 LAB — LIPID PANEL
Cholesterol: 136 mg/dL (ref 0–200)
HDL: 40.6 mg/dL (ref >39.00)
LDL Cholesterol: 84 mg/dL (ref 0–99)
NonHDL: 95.77
Total CHOL/HDL Ratio: 3
Triglycerides: 61 mg/dL (ref 0.0–149.0)
VLDL: 12.2 mg/dL (ref 0.0–40.0)

## 2016-11-30 LAB — HEPATIC FUNCTION PANEL
ALK PHOS: 69 U/L (ref 39–117)
ALT: 20 U/L (ref 0–53)
AST: 18 U/L (ref 0–37)
Albumin: 3.7 g/dL (ref 3.5–5.2)
BILIRUBIN TOTAL: 0.4 mg/dL (ref 0.2–1.2)
Bilirubin, Direct: 0.1 mg/dL (ref 0.0–0.3)
Total Protein: 6.9 g/dL (ref 6.0–8.3)

## 2016-11-30 LAB — CBC WITH DIFFERENTIAL/PLATELET
BASOS ABS: 0 10*3/uL (ref 0.0–0.1)
Basophils Relative: 0.7 % (ref 0.0–3.0)
EOS ABS: 0.3 10*3/uL (ref 0.0–0.7)
Eosinophils Relative: 7.6 % — ABNORMAL HIGH (ref 0.0–5.0)
HCT: 36.1 % — ABNORMAL LOW (ref 39.0–52.0)
Hemoglobin: 11.6 g/dL — ABNORMAL LOW (ref 13.0–17.0)
Lymphocytes Relative: 31.8 % (ref 12.0–46.0)
Lymphs Abs: 1.4 10*3/uL (ref 0.7–4.0)
MCHC: 32.2 g/dL (ref 30.0–36.0)
MCV: 78.7 fl (ref 78.0–100.0)
MONO ABS: 0.5 10*3/uL (ref 0.1–1.0)
Monocytes Relative: 12.1 % — ABNORMAL HIGH (ref 3.0–12.0)
NEUTROS PCT: 47.8 % (ref 43.0–77.0)
Neutro Abs: 2.1 10*3/uL (ref 1.4–7.7)
Platelets: 279 10*3/uL (ref 150.0–400.0)
RBC: 4.59 Mil/uL (ref 4.22–5.81)
RDW: 15.9 % — ABNORMAL HIGH (ref 11.5–15.5)
WBC: 4.5 10*3/uL (ref 4.0–10.5)

## 2016-11-30 LAB — BASIC METABOLIC PANEL
BUN: 13 mg/dL (ref 6–23)
CO2: 29 mEq/L (ref 19–32)
Calcium: 9.2 mg/dL (ref 8.4–10.5)
Chloride: 108 mEq/L (ref 96–112)
Creatinine, Ser: 0.87 mg/dL (ref 0.40–1.50)
GFR: 114.99 mL/min (ref >60.00)
GLUCOSE: 95 mg/dL (ref 70–99)
POTASSIUM: 4.2 meq/L (ref 3.5–5.1)
Sodium: 143 mEq/L (ref 135–145)

## 2016-11-30 LAB — TSH: TSH: 2.29 u[IU]/mL (ref 0.35–4.50)

## 2016-11-30 LAB — PSA: PSA: 1.92 ng/mL (ref 0.10–4.00)

## 2016-12-06 ENCOUNTER — Telehealth: Payer: Self-pay | Admitting: Family Medicine

## 2016-12-06 NOTE — Telephone Encounter (Signed)
Patient states that [name] will be calling regarding a RX that Dr. Tawanna Cooler wrote for sildenafil (VIAGRA) 100 MG tablet and they will be needing a copy of the RX faxed to them but the Li will call.  Contact Info: [phone]

## 2016-12-07 ENCOUNTER — Encounter: Payer: Commercial Managed Care - HMO | Admitting: Family Medicine

## 2016-12-08 NOTE — Telephone Encounter (Signed)
Paper work received and filled out.  Sent back to [facility].  Received confirmation that the fax was successful.  Sent to scan.

## 2016-12-10 ENCOUNTER — Other Ambulatory Visit: Payer: Self-pay | Admitting: Family Medicine

## 2016-12-13 ENCOUNTER — Encounter: Payer: Self-pay | Admitting: Family Medicine

## 2016-12-13 ENCOUNTER — Ambulatory Visit (INDEPENDENT_AMBULATORY_CARE_PROVIDER_SITE_OTHER): Payer: Commercial Managed Care - HMO | Admitting: Family Medicine

## 2016-12-13 VITALS — BP 122/74 | Temp 98.0°F | Ht 75.0 in | Wt 254.0 lb

## 2016-12-13 DIAGNOSIS — Z136 Encounter for screening for cardiovascular disorders: Secondary | ICD-10-CM | POA: Diagnosis not present

## 2016-12-13 DIAGNOSIS — K222 Esophageal obstruction: Secondary | ICD-10-CM | POA: Diagnosis not present

## 2016-12-13 DIAGNOSIS — Z0000 Encounter for general adult medical examination without abnormal findings: Secondary | ICD-10-CM

## 2016-12-13 DIAGNOSIS — I10 Essential (primary) hypertension: Secondary | ICD-10-CM

## 2016-12-13 DIAGNOSIS — N521 Erectile dysfunction due to diseases classified elsewhere: Secondary | ICD-10-CM

## 2016-12-13 DIAGNOSIS — M172 Bilateral post-traumatic osteoarthritis of knee: Secondary | ICD-10-CM | POA: Diagnosis not present

## 2016-12-13 MED ORDER — SILDENAFIL CITRATE 20 MG PO TABS: ORAL_TABLET | ORAL | 11 refills | Status: DC

## 2016-12-13 MED ORDER — ENALAPRIL MALEATE 20 MG PO TABS: ORAL_TABLET | ORAL | 4 refills | Status: DC

## 2016-12-13 MED ORDER — FUROSEMIDE 40 MG PO TABS: ORAL_TABLET | ORAL | 4 refills | Status: DC

## 2016-12-13 MED ORDER — OMEPRAZOLE 20 MG PO CPDR: DELAYED_RELEASE_CAPSULE | ORAL | 4 refills | Status: DC

## 2016-12-13 MED ORDER — SILDENAFIL CITRATE 100 MG PO TABS: 50.0000 mg | ORAL_TABLET | Freq: Every day | ORAL | 10 refills | Status: DC | PRN

## 2016-12-13 MED ORDER — DOXAZOSIN MESYLATE 8 MG PO TABS: ORAL_TABLET | ORAL | 4 refills | Status: DC

## 2016-12-13 NOTE — Progress Notes (Signed)
[name] is a 61 year old married male nonsmoker who comes in today for annual evaluation because of a history of hypertension, reflux esophagitis, erectile dysfunction, osteoarthritis.......... status post bilateral knee replacements with a redo on the left a couple years ago..... And a new problem of anemia  He takes Cardura 8 mg daily along with Vasotec 20 mg daily and Lasix 40 mg twice a day for hypertension. BP is 122/74  He has history of osteoarthritis. He says both knees replaced in the left knee revision a couple years ago. He's been on 600 mg of Motrin twice a day. Had a colonoscopy 2 weeks ago was normal. Screening labs show a hemoglobin of 11.6. Probably related to the anti-inflammatories. We'll stop the anti-inflammatories and follow-up CBC in 6 weeks after a nightly iron tablet.  He uses Viagra or the generic for ED  He gets routine eye care, dental care, colonoscopy as noted above a couple weeks ago was normal.  Vaccinations up-to-date except he is due a shingles vaccine. Information given.  Social history.......[name] married lives here in [place] works 3 jobs. 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  EKG was done because history of hypertension. Cardiogram normal and unchanged from previous cardiograms.  BP 122/74 (BP Location: Left Arm, Patient Position: Sitting, Cuff Size: Large)   Temp 98 F (36.7 C) (Oral)   Ht  (1.905 m)   Wt 254 lb (115.2 kg)   BMI 31.75 kg/m  Examination HEENT was negative neck was supple thyroid not enlarged no carotid bruits cardiopulmonary exam normal Dahms exam normal genitalia normal circumcised male rectum normal stool guaiac-negative prostate normal extremities normal skin no peripheral pulses normal except for scars right and left knee from previous total knee replacements  #1 hypertension at goal........ continue current therapy  #2 osteoarthritis...Donald KitchenMarland KitchenMarland Li status post right left total knee  replacements.......... followed by orthopedics when necessary  #3 anemia..............[name] probably secondary to NSAIDs....... stop NSAIDs........ one iron tablet at bedtime...Donald KitchenMarland KitchenMarland Li follow-up in 4-6 weeks  #4 status post right and left total knee replacements  #5 ED refill Viagra  #6 history of reflux esophagitis continue Prilosec 20 mg daily.[name]

## 2016-12-13 NOTE — Progress Notes (Signed)
Pre visit review using our clinic review tool, if applicable. No additional management support is needed unless otherwise documented below in the visit note. 

## 2016-12-13 NOTE — Patient Instructions (Signed)
Stop the Motrin,,,,,,,,,,,,,,,, Tylenol 2 tabs twice daily for joint pain  Over-the-counter iron pills,,,,,,,,,,,,, 1 at bedtime for 6 weeks,,,,,,,,,, follow-up in 6 weeks

## 2016-12-29 ENCOUNTER — Other Ambulatory Visit: Payer: Self-pay | Admitting: Family Medicine

## 2016-12-29 NOTE — Telephone Encounter (Signed)
Denied.  Filled on 12/13/16 for one year.  Message sent to the pharmacy to check file.

## 2017-01-24 ENCOUNTER — Ambulatory Visit (INDEPENDENT_AMBULATORY_CARE_PROVIDER_SITE_OTHER): Payer: Commercial Managed Care - HMO | Admitting: Family Medicine

## 2017-01-24 ENCOUNTER — Encounter: Payer: Self-pay | Admitting: Family Medicine

## 2017-01-24 VITALS — BP 124/76 | Temp 98.3°F | Wt 257.0 lb

## 2017-01-24 DIAGNOSIS — D72818 Other decreased white blood cell count: Secondary | ICD-10-CM

## 2017-01-24 DIAGNOSIS — D729 Disorder of white blood cells, unspecified: Secondary | ICD-10-CM

## 2017-01-24 LAB — FERRITIN: FERRITIN: 33.5 ng/mL (ref 22.0–322.0)

## 2017-01-24 LAB — CBC WITH DIFFERENTIAL/PLATELET
Basophils Absolute: 0 10*3/uL (ref 0.0–0.1)
Basophils Relative: 0.7 % (ref 0.0–3.0)
Eosinophils Absolute: 0.2 10*3/uL (ref 0.0–0.7)
Eosinophils Relative: 4.1 % (ref 0.0–5.0)
HCT: 36.5 % — ABNORMAL LOW (ref 39.0–52.0)
Hemoglobin: 11.8 g/dL — ABNORMAL LOW (ref 13.0–17.0)
Lymphocytes Relative: 29.2 % (ref 12.0–46.0)
Lymphs Abs: 1.6 10*3/uL (ref 0.7–4.0)
MCHC: 32.3 g/dL (ref 30.0–36.0)
MCV: 79 fl (ref 78.0–100.0)
Monocytes Absolute: 0.5 10*3/uL (ref 0.1–1.0)
Monocytes Relative: 8.9 % (ref 3.0–12.0)
Neutro Abs: 3.2 10*3/uL (ref 1.4–7.7)
Neutrophils Relative %: 57.1 % (ref 43.0–77.0)
Platelets: 299 10*3/uL (ref 150.0–400.0)
RBC: 4.62 Mil/uL (ref 4.22–5.81)
RDW: 15.4 % (ref 11.5–15.5)
WBC: 5.6 10*3/uL (ref 4.0–10.5)

## 2017-01-24 LAB — VITAMIN B12: VITAMIN B 12: 1357 pg/mL — AB (ref 211–911)

## 2017-01-24 LAB — POCT HEMOGLOBIN: HEMOGLOBIN: 5.8 g/dL — AB (ref 14.1–18.1)

## 2017-01-24 LAB — IRON: Iron: 26 ug/dL — ABNORMAL LOW (ref 42–165)

## 2017-01-24 NOTE — Progress Notes (Signed)
[name] is a 61 year old male who comes in today for follow-up of anemia  He been taking a lot of NSAIDs because of osteoarthritis and developed a low-grade anemia. We stopped all the NSAIDs put on proton X and iron. He said today for follow-up hemoglobin.  BP 124/76 (BP Location: Left Arm)   Temp 98.3 F (36.8 C) (Oral)   Wt 257 lb (116.6 kg)   BMI 32.12 kg/m  Gel was well-developed well-nourished male no acute distress examination shows very pink conjunctiva when the past they were pale  Anemia probably secondary to NSAIDs,,,,,,,,,,,, check H&H today

## 2017-01-24 NOTE — Patient Instructions (Signed)
.............................   Labs today,,,,,,,,,,,, I will call you the report in the morning

## 2017-02-16 ENCOUNTER — Other Ambulatory Visit: Payer: Self-pay | Admitting: Family Medicine

## 2017-02-16 DIAGNOSIS — R7989 Other specified abnormal findings of blood chemistry: Secondary | ICD-10-CM

## 2017-02-16 DIAGNOSIS — D649 Anemia, unspecified: Secondary | ICD-10-CM

## 2017-03-15 ENCOUNTER — Encounter: Payer: Self-pay | Admitting: Hematology

## 2017-03-15 ENCOUNTER — Telehealth: Payer: Self-pay | Admitting: Hematology

## 2017-03-15 NOTE — Telephone Encounter (Signed)
Received a call from the pt to schedule a hem appt. Appt scheduled for the pt to see Dr. Candise CheKale on 8/14 at 1pm. Pt aware to arrive 30 minutes early. Agreed to the appt date and time. Letter mailed.

## 2017-04-04 DIAGNOSIS — M545 Low back pain: Secondary | ICD-10-CM | POA: Diagnosis not present

## 2017-04-19 ENCOUNTER — Encounter: Payer: Self-pay | Admitting: Hematology

## 2017-04-19 ENCOUNTER — Telehealth: Payer: Self-pay | Admitting: Hematology

## 2017-04-19 ENCOUNTER — Ambulatory Visit (HOSPITAL_BASED_OUTPATIENT_CLINIC_OR_DEPARTMENT_OTHER): Payer: Commercial Managed Care - HMO | Admitting: Hematology

## 2017-04-19 ENCOUNTER — Ambulatory Visit (HOSPITAL_BASED_OUTPATIENT_CLINIC_OR_DEPARTMENT_OTHER): Payer: Commercial Managed Care - HMO

## 2017-04-19 VITALS — BP 126/77 | HR 83 | Temp 99.4°F | Resp 18 | Ht 75.0 in | Wt 256.5 lb

## 2017-04-19 DIAGNOSIS — D500 Iron deficiency anemia secondary to blood loss (chronic): Secondary | ICD-10-CM

## 2017-04-19 DIAGNOSIS — K922 Gastrointestinal hemorrhage, unspecified: Secondary | ICD-10-CM | POA: Diagnosis not present

## 2017-04-19 DIAGNOSIS — D509 Iron deficiency anemia, unspecified: Secondary | ICD-10-CM

## 2017-04-19 LAB — IRON AND TIBC
%SAT: 13 % — AB (ref 20–55)
Iron: 35 ug/dL — ABNORMAL LOW (ref 42–163)
TIBC: 274 ug/dL (ref 202–409)
UIBC: 238 ug/dL (ref 117–376)

## 2017-04-19 LAB — COMPREHENSIVE METABOLIC PANEL
ALT: 29 U/L (ref 0–55)
AST: 19 U/L (ref 5–34)
Albumin: 3.3 g/dL — ABNORMAL LOW (ref 3.5–5.0)
Alkaline Phosphatase: 78 U/L (ref 40–150)
Anion Gap: 8 mEq/L (ref 3–11)
BUN: 13 mg/dL (ref 7.0–26.0)
CHLORIDE: 103 meq/L (ref 98–109)
CO2: 27 meq/L (ref 22–29)
CREATININE: 1 mg/dL (ref 0.7–1.3)
Calcium: 9.5 mg/dL (ref 8.4–10.4)
EGFR: >90 mL/min/{1.73_m2} (ref >90)
Glucose: 100 mg/dl (ref 70–140)
Potassium: 4.3 mEq/L (ref 3.5–5.1)
Sodium: 138 mEq/L (ref 136–145)
Total Bilirubin: 0.41 mg/dL (ref 0.20–1.20)
Total Protein: 7.5 g/dL (ref 6.4–8.3)

## 2017-04-19 LAB — CBC & DIFF AND RETIC
BASO%: 0.3 % (ref 0.0–2.0)
Basophils Absolute: 0 10*3/uL (ref 0.0–0.1)
EOS%: 1.6 % (ref 0.0–7.0)
Eosinophils Absolute: 0.1 10*3/uL (ref 0.0–0.5)
HCT: 39.2 % (ref 38.4–49.9)
HGB: 12.6 g/dL — ABNORMAL LOW (ref 13.0–17.1)
Immature Retic Fract: 5 % (ref 3.00–10.60)
LYMPH#: 1.5 10*3/uL (ref 0.9–3.3)
LYMPH%: 21.2 % (ref 14.0–49.0)
MCH: 25.8 pg — ABNORMAL LOW (ref 27.2–33.4)
MCHC: 32.1 g/dL (ref 32.0–36.0)
MCV: 80.2 fL (ref 79.3–98.0)
MONO#: 0.4 10*3/uL (ref 0.1–0.9)
MONO%: 6.1 % (ref 0.0–14.0)
NEUT#: 4.8 10*3/uL (ref 1.5–6.5)
NEUT%: 70.8 % (ref 39.0–75.0)
Platelets: 257 10*3/uL (ref 140–400)
RBC: 4.89 10*6/uL (ref 4.20–5.82)
RDW: 15.6 % — AB (ref 11.0–14.6)
RETIC %: 1 % (ref 0.80–1.80)
RETIC CT ABS: 48.9 10*3/uL (ref 34.80–93.90)
WBC: 6.8 10*3/uL (ref 4.0–10.3)

## 2017-04-19 LAB — FERRITIN: Ferritin: 83 ng/ml (ref 22–316)

## 2017-04-19 NOTE — Patient Instructions (Signed)
Thank you for choosing [facility] to provide your oncology and hematology care.  To afford each patient quality time with our providers, please arrive 30 minutes before your scheduled appointment time.  If you arrive late for your appointment, you may be asked to reschedule.  We strive to give you quality time with our providers, and arriving late affects you and other patients whose appointments are after yours.   If you are a no show for multiple scheduled visits, you may be dismissed from the clinic at the providers discretion.    Again, thank you for choosing [facility], our hope is that these requests will decrease the amount of time that you wait before being seen by our physicians.  ______________________________________________________________________  Should you have questions after your visit to the [facility], please contact our office at [phone] between the hours of 8:30 and 4:30 p.m.    Voicemails left after 4:30p.m will not be returned until the following business day.    For prescription refill requests, please have your pharmacy contact us directly.  Please also try to allow 48 hours for prescription requests.    Please contact the scheduling department for questions regarding scheduling.  For scheduling of procedures such as PET scans, CT scans, MRI, Ultrasound, etc please contact central scheduling at [phone].    Resources For Cancer Patients and Caregivers:   Oncolink.org:  A wonderful resource for patients and healthcare providers for information regarding your disease, ways to tract your treatment, what to expect, etc.     American Cancer Society:  800-227-2345  Can help patients locate various types of support and financial assistance  Cancer Care: 1-800-813-HOPE (4673) Provides financial assistance, online support groups, medication/co-pay assistance.    [place] DSS:  336-641-3447 Where to apply for food  stamps, Medicaid, and utility assistance  Medicare Rights Center: 800-333-4114 Helps people with Medicare understand their rights and benefits, navigate the Medicare system, and secure the quality healthcare they deserve  SCAT: 336-333-6589 [place] [employer]'s shared-ride transportation service for eligible riders who have a disability that prevents them from riding the fixed route bus.    For additional information on assistance programs please contact our social worker:   Grier Hock/Abigail Elmore:  [phone]            

## 2017-04-19 NOTE — Telephone Encounter (Signed)
Patient sent back to lab and given avs report. Per 8/14 los f/u based on labs.

## 2017-04-19 NOTE — Progress Notes (Signed)
[name]    HEMATOLOGY/ONCOLOGY CONSULTATION NOTE  Date of Service: 04/19/2017  Patient Care Team: Roderick Pee, MD as PCP - General  CHIEF COMPLAINTS/PURPOSE OF CONSULTATION:  Anemia  HISTORY OF PRESENTING ILLNESS:   [name] is a wonderful 61 y.o. male who has been referred to [place] by Dr .Tawanna Cooler, Eugenio Hoes, MD  for evaluation and management of Anemia.  Patient has a history of hypertension, GERD, benign esophageal strictures, osteoarthritis, mainly erectile dysfunction who was noted by his primary care physician to have mild anemia with a hemoglobin of 11.6 and MCV of 78 on 11/30/2016.  Previously had a normal hemoglobin of 13.1 with an MCV of 83 in October 2015.  Patient had been taking ibuprofen 600 mg twice a daily for 5 years for back pains related to her lumbar spinal stenosis and was complaining of bright red blood per rectum on and off for 6 months.  He notes that he stopped his ibuprofen since April 2018. But has still been using BC powder quite frequently since then. He had colonoscopy with Dr. Lavon Paganini on 11/22/2016 which showed Multiple small and large-mouthed diverticula were found in the entire colon. There was narrowing of the colon in association with the diverticular opening. There was evidence of diverticular spasm. Peri-diverticular erythema was seen. There was evidence of an impacted diverticulum. A segmental area of granular mucosa thickened fold peridiverticular was found in the sigmoid colon. Biopsy showed no evidence of dysplasia or adenomatous change or malignancy.  He previously has had an EGD in 2008.   He notes that he has been taking iron pills over-the-counter since April 2018. He could not tell me which type of pill at how much.  He was also previously on aspirin 81 mg by mouth daily which has been discontinued about a month ago. He reports that he was on prednisone for about a week and completed it about 10 days ago. We discussed that this could increase  his risk of NSAID ulcerations.  Labs today showed that his hemoglobin has improved to 12.6 with an MCV of 80 suggesting response to oral iron. No thrombocytopenia or WBC abnormalities.  He notes no overt GI bleeding at this time.  No weight loss no night sweats or fevers or chills.   MEDICAL HISTORY:  Past Medical History:  Diagnosis Date  . Arthritis   . GERD (gastroesophageal reflux disease)   . Hypertension   Esophageal stricture status post dilatation Maintain erectile dysfunction Osteoarthritis with ? Spinal stenosis and chronic back pains   SURGICAL HISTORY: Past Surgical History:  Procedure Laterality Date  . MEDIAL PARTIAL KNEE REPLACEMENT Bilateral   . SHOULDER SURGERY Left     SOCIAL HISTORY: Social History   Social History  . Marital status: Married    Spouse name: N/A  . Number of children: N/A  . Years of education: N/A   Occupational History  . Not on file.   Social History Main Topics  . Smoking status: Former Smoker    Quit date: 01/12/1991  . Smokeless tobacco: Never Used  . Alcohol use No  . Drug use: No  . Sexual activity: Not on file   Other Topics Concern  . Not on file   Social History Narrative  . No narrative on file    FAMILY HISTORY: Family History  Problem Relation Age of Onset  . Colon polyps Neg Hx   . Rectal cancer Neg Hx   . Stomach cancer Neg Hx   . Esophageal  cancer Neg Hx     ALLERGIES:  is allergic to morphine.  MEDICATIONS:  Current Outpatient Prescriptions  Medication Sig Dispense Refill  . aspirin 81 MG tablet Take 81 mg by mouth daily.      [name]. doxazosin (CARDURA) 8 MG tablet TAKE 1 TABLET (8 MG TOTAL) BY MOUTH AT BEDTIME. 90 tablet 4  . enalapril (VASOTEC) 20 MG tablet TAKE 1 TABLET (20 MG TOTAL) BY MOUTH DAILY. 90 tablet 4  . furosemide (LASIX) 40 MG tablet TAKE 1 TABLET (40 MG TOTAL) BY MOUTH 2 (TWO) TIMES DAILY. 200 tablet 4  . Multiple Vitamin (MULTIVITAMIN) tablet Take 1 tablet by mouth daily.    [name].  omeprazole (PRILOSEC) 20 MG capsule TAKE 1 CAPSULE (20 MG TOTAL) BY MOUTH DAILY. 100 capsule 4  . predniSONE (STERAPRED UNI-PAK 48 TAB) 5 MG (48) TBPK tablet     . sildenafil (REVATIO) 20 MG tablet Take 1 to 2 tabs 2 - 3 hours before sex 20 tablet 11  . sildenafil (VIAGRA) 100 MG tablet Take 0.5-1 tablets (50-100 mg total) by mouth daily as needed for erectile dysfunction. 10 tablet 10   Current Facility-Administered Medications  Medication Dose Route Frequency Provider Last Rate Last Dose  . 0.9 %  sodium chloride infusion  500 mL Intravenous Continuous Nandigam, Eleonore ChiquitoKavitha V, MD        REVIEW OF SYSTEMS:    10 Point review of Systems was done is negative except as noted above.  PHYSICAL EXAMINATION: ECOG PERFORMANCE STATUS: 1 - Symptomatic but completely ambulatory  . Vitals:   04/19/17 1307  BP: 126/77  Pulse: 83  Resp: 18  Temp: 99.4 F (37.4 C)  SpO2: 98%   Filed Weights   04/19/17 1307  Weight: 256 lb 8 oz (116.3 kg)   .Body mass index is 32.06 kg/m.  GENERAL:alert, in no acute distress and comfortable SKIN: no acute rashes, no significant lesions EYES: conjunctiva are pink and non-injected, sclera anicteric OROPHARYNX: MMM, no exudates, no oropharyngeal erythema or ulceration NECK: supple, no JVD LYMPH:  no palpable lymphadenopathy in the cervical, axillary or inguinal regions LUNGS: clear to auscultation b/l with normal respiratory effort HEART: regular rate & rhythm ABDOMEN:  normoactive bowel sounds , non tender, not distended. Extremity: no pedal edema PSYCH: alert & oriented x 3 with fluent speech NEURO: no focal motor/sensory deficits  LABORATORY DATA:  I have reviewed the data as listed  . CBC Latest Ref Rng & Units 04/19/2017 01/24/2017 01/24/2017  WBC 4.0 - 10.3 10e3/uL 6.8 5.6 -  Hemoglobin 13.0 - 17.1 g/dL 12.6(L) 11.8(L) 5.8(A)  Hematocrit 38.4 - 49.9 % 39.2 36.5(L) -  Platelets 140 - 400 10e3/uL 257 299.0 -    . CMP Latest Ref Rng & Units  04/19/2017 04/19/2017 11/30/2016  Glucose 70 - 140 mg/dl 161100 - 95  BUN 7.0 - 09.626.0 mg/dL 04.513.0 - 13  Creatinine 0.7 - 1.3 mg/dL 1.0 - 4.090.87  Sodium 811136 - 145 mEq/L 138 - 143  Potassium 3.5 - 5.1 mEq/L 4.3 - 4.2  Chloride 96 - 112 mEq/L - - 108  CO2 22 - 29 mEq/L 27 - 29  Calcium 8.4 - 10.4 mg/dL 9.5 - 9.2  Total Protein 6.0 - 8.5 g/dL 7.5 7.2 6.9  Total Bilirubin 0.20 - 1.20 mg/dL 9.140.41 - 0.4  Alkaline Phos 40 - 150 U/L 78 - 69  AST 5 - 34 U/L 19 - 18  ALT 0 - 55 U/L 29 - 20   Component  Latest Ref Rng & Units 04/19/2017  IgG (Immunoglobin G), Serum     700 - 1600 mg/dL 4,098  IgA/Immunoglobulin A, Serum     90 - 386 mg/dL 119  IgM, Qn, Serum     20 - 172 mg/dL 86  Total Protein     6.0 - 8.5 g/dL 7.2  Albumin SerPl Elph-Mcnc     2.9 - 4.4 g/dL 3.2  Alpha 1     0.0 - 0.4 g/dL 0.3  Alpha2 Glob SerPl Elph-Mcnc     0.4 - 1.0 g/dL 1.0  B-Globulin SerPl Elph-Mcnc     0.7 - 1.3 g/dL 1.1  Gamma Glob SerPl Elph-Mcnc     0.4 - 1.8 g/dL 1.6  M Protein SerPl Elph-Mcnc     Not Observed g/dL Not Observed  Globulin, Total     2.2 - 3.9 g/dL 4.0 (H)  Albumin/Glob SerPl     0.7 - 1.7 0.9  IFE 1      Comment  Please Note (HCV):      Comment  Sed Rate     0 - 30 mm/hr 37 (H)     RADIOGRAPHIC STUDIES: I have personally reviewed the radiological images as listed and agreed with the findings in the report. No results found.  ASSESSMENT & PLAN:   61 year old male with  #1 Microcytic anemia due to blood loss from GI bleeding and iron deficiency as a result. #2 iron deficiency likely from GI bleeding and chronic PPI use for the last 15 years each might decrease oral iron absorption. #3 bright red blood per rectum likely related chronic use of large dose of NSAIDs. Could be diverticular. Follows with Dr. Lavon Paganini for GI workup and had a colonoscopy as noted above in March 2018. Was on ibuprofen 600 mg twice daily for 5 years. This was discontinued in March but he still was using [facility]  powders over-the-counter. Has been off his baby aspirin for about a month.  Plan -Patient's hemoglobin has normalized with oral iron therapy as per his primary care physician and microcytosis has resolved as well. -His ferritin level is going up appropriately with oral iron replacement suggesting adequate oral absorption. -he was strongly suggested to hold off on any over-the-counter NSAID use. He is also off his baby aspirin. -Follow-up with GI to decide on need for additional GI workup to find the source of bleeding. -Continue oral iron replacement with primary care physician to achieve ferritin of more than 100 and iron saturation of more than 20%. -No other indication for primary bone marrow disorder at this time. Myeloma panel negative. -He will need to follow-up with his primary care physician/orthopedics doctor to have an appropriate pain management plan so that he does not land up using NSAIDs again in having GI bleeding -All the patient's questions were answered in details.  Would recommend repeat CBC, ferritin and iron profile with primary care physician in 3 months and adjust oral iron replacement accordingly.  Kindly reconsult [place] if any new questions or concerns arise.  All of the patients questions were answered with apparent satisfaction. The patient knows to call the clinic with any problems, questions or concerns.  I spent 40 minutes counseling the patient face to face. The total time spent in the appointment was 60 minutes and more than 50% was on counseling and direct patient cares.    Wyvonnia Lora MD MS AAHIVMS [facility] Hematology/Oncology Physician [facility]  (Office):       [phone] (  Work cell):  [phone] (Fax):           [phone]  04/19/2017 1:12 PM

## 2017-04-20 LAB — MULTIPLE MYELOMA PANEL, SERUM
ALBUMIN/GLOB SERPL: 0.9 (ref 0.7–1.7)
ALPHA 1: 0.3 g/dL (ref 0.0–0.4)
ALPHA2 GLOB SERPL ELPH-MCNC: 1 g/dL (ref 0.4–1.0)
Albumin SerPl Elph-Mcnc: 3.2 g/dL (ref 2.9–4.4)
B-GLOBULIN SERPL ELPH-MCNC: 1.1 g/dL (ref 0.7–1.3)
GLOBULIN, TOTAL: 4 g/dL — AB (ref 2.2–3.9)
Gamma Glob SerPl Elph-Mcnc: 1.6 g/dL (ref 0.4–1.8)
IGM (IMMUNOGLOBIN M), SRM: 86 mg/dL (ref 20–172)
IgA, Qn, Serum: 306 mg/dL (ref 90–386)
Total Protein: 7.2 g/dL (ref 6.0–8.5)

## 2017-04-20 LAB — SEDIMENTATION RATE: SED RATE: 37 mm/h — AB (ref 0–30)

## 2017-04-26 ENCOUNTER — Telehealth: Payer: Self-pay | Admitting: *Deleted

## 2017-04-26 NOTE — Telephone Encounter (Signed)
-----   Message from Johney Maine, MD sent at 04/26/2017 11:23 AM EDT ----- Regarding: Labs Plz let Mr Deignan know his hgb has normalized. Continue oral iron as per primary care physician. No NSAIDs due to GI bleeding issues. Continue follow-up with primary care physician for oral iron treatment and repeat labs with PCP in 2-3 months. I have sent my recommendations to his primary care physician.  thx GK

## 2017-04-26 NOTE — Telephone Encounter (Signed)
Per staff message, spoke with pt to inform him of normalized hgb.  Instructed to continue oral iron as directed by PCP, avoid nsaids, and follow up with PCP.  Pt verbalized understanding.

## 2017-05-11 DIAGNOSIS — M545 Low back pain: Secondary | ICD-10-CM | POA: Diagnosis not present

## 2017-05-17 DIAGNOSIS — M545 Low back pain: Secondary | ICD-10-CM | POA: Diagnosis not present

## 2017-05-19 DIAGNOSIS — M545 Low back pain: Secondary | ICD-10-CM | POA: Diagnosis not present

## 2017-05-20 DIAGNOSIS — M545 Low back pain: Secondary | ICD-10-CM | POA: Diagnosis not present

## 2017-05-20 DIAGNOSIS — M47816 Spondylosis without myelopathy or radiculopathy, lumbar region: Secondary | ICD-10-CM | POA: Diagnosis not present

## 2017-05-20 DIAGNOSIS — M5416 Radiculopathy, lumbar region: Secondary | ICD-10-CM | POA: Diagnosis not present

## 2017-06-08 DIAGNOSIS — M545 Low back pain: Secondary | ICD-10-CM | POA: Diagnosis not present

## 2017-06-20 DIAGNOSIS — M5416 Radiculopathy, lumbar region: Secondary | ICD-10-CM | POA: Diagnosis not present

## 2017-12-25 ENCOUNTER — Other Ambulatory Visit: Payer: Self-pay | Admitting: Family Medicine

## 2017-12-28 ENCOUNTER — Telehealth: Payer: Self-pay | Admitting: Family Medicine

## 2017-12-28 NOTE — Telephone Encounter (Incomplete)
Copied from CRM [id number]. Topic: Quick Communication - Rx Refill/Question >> Dec 28, 2017  3:22 PM Landry MellowFoltz, Melissa J wrote: Medication: sildenafil (VIAGRA) 100 MG tablet Has the patient contacted their pharmacy? {yes [id number]} (Agent: If no, request that the patient contact the pharmacy for the refill.) Preferred Pharmacy (with phone number or street name): [facility]  Agent: Please be advised that RX refills may take up to 3 business days. We ask that you follow-up with your pharmacy.  Pharm has sent a fax 2 times

## 2017-12-30 NOTE — Telephone Encounter (Signed)
Refill request for Viagra 100 mg  Called  [facility] [address] spoke to staff no record of Rx filled there.  Original rx written 12/13/2016  LOV 01/24/2017 with Dr Tawanna Coolerodd  Pt [place] [facility] [place] [facility] states they have faxed request x  2

## 2017-12-30 NOTE — Telephone Encounter (Signed)
Pt is requesting to have Rx for both Sildenafil 100 mg/ 20 mg printed and he will pick up the Rx in the office.

## 2017-12-31 ENCOUNTER — Other Ambulatory Visit: Payer: Self-pay | Admitting: Family Medicine

## 2018-01-02 NOTE — Telephone Encounter (Signed)
Pt called to check status

## 2018-01-03 ENCOUNTER — Other Ambulatory Visit: Payer: Self-pay

## 2018-01-03 DIAGNOSIS — N521 Erectile dysfunction due to diseases classified elsewhere: Secondary | ICD-10-CM

## 2018-01-03 MED ORDER — SILDENAFIL CITRATE 20 MG PO TABS: ORAL_TABLET | ORAL | 10 refills | Status: DC

## 2018-01-03 MED ORDER — SILDENAFIL CITRATE 100 MG PO TABS: 50.0000 mg | ORAL_TABLET | Freq: Every day | ORAL | 10 refills | Status: DC | PRN

## 2018-01-03 NOTE — Telephone Encounter (Signed)
Rx has been printed ,signed and is ready for pick up in the office, pt is aware.

## 2018-01-04 ENCOUNTER — Encounter: Payer: Self-pay | Admitting: Family Medicine

## 2018-01-04 ENCOUNTER — Ambulatory Visit (INDEPENDENT_AMBULATORY_CARE_PROVIDER_SITE_OTHER): Payer: 59 | Admitting: Family Medicine

## 2018-01-04 VITALS — BP 128/88 | HR 78 | Temp 98.0°F | Ht 75.0 in | Wt 256.0 lb

## 2018-01-04 DIAGNOSIS — M172 Bilateral post-traumatic osteoarthritis of knee: Secondary | ICD-10-CM

## 2018-01-04 DIAGNOSIS — I10 Essential (primary) hypertension: Secondary | ICD-10-CM

## 2018-01-04 DIAGNOSIS — Z23 Encounter for immunization: Secondary | ICD-10-CM | POA: Diagnosis not present

## 2018-01-04 DIAGNOSIS — Z0000 Encounter for general adult medical examination without abnormal findings: Secondary | ICD-10-CM

## 2018-01-04 DIAGNOSIS — N521 Erectile dysfunction due to diseases classified elsewhere: Secondary | ICD-10-CM | POA: Diagnosis not present

## 2018-01-04 LAB — HEPATIC FUNCTION PANEL
ALT: 18 U/L (ref 0–53)
AST: 16 U/L (ref 0–37)
Albumin: 3.7 g/dL (ref 3.5–5.2)
Alkaline Phosphatase: 65 U/L (ref 39–117)
BILIRUBIN DIRECT: 0.1 mg/dL (ref 0.0–0.3)
Total Bilirubin: 0.6 mg/dL (ref 0.2–1.2)
Total Protein: 6.8 g/dL (ref 6.0–8.3)

## 2018-01-04 LAB — BASIC METABOLIC PANEL
BUN: 12 mg/dL (ref 6–23)
CHLORIDE: 107 meq/L (ref 96–112)
CO2: 31 meq/L (ref 19–32)
Calcium: 9.1 mg/dL (ref 8.4–10.5)
Creatinine, Ser: 0.89 mg/dL (ref 0.40–1.50)
GFR: 111.6 mL/min (ref >60.00)
Glucose, Bld: 89 mg/dL (ref 70–99)
POTASSIUM: 4.6 meq/L (ref 3.5–5.1)
SODIUM: 142 meq/L (ref 135–145)

## 2018-01-04 LAB — CBC WITH DIFFERENTIAL/PLATELET
Basophils Absolute: 0 10*3/uL (ref 0.0–0.1)
Basophils Relative: 0.3 % (ref 0.0–3.0)
EOS ABS: 0.2 10*3/uL (ref 0.0–0.7)
EOS PCT: 3.2 % (ref 0.0–5.0)
HEMATOCRIT: 39.1 % (ref 39.0–52.0)
HEMOGLOBIN: 12.7 g/dL — AB (ref 13.0–17.0)
LYMPHS PCT: 25.5 % (ref 12.0–46.0)
Lymphs Abs: 1.3 10*3/uL (ref 0.7–4.0)
MCHC: 32.5 g/dL (ref 30.0–36.0)
MCV: 80.6 fl (ref 78.0–100.0)
Monocytes Absolute: 0.4 10*3/uL (ref 0.1–1.0)
Monocytes Relative: 8.4 % (ref 3.0–12.0)
NEUTROS ABS: 3.2 10*3/uL (ref 1.4–7.7)
Neutrophils Relative %: 62.6 % (ref 43.0–77.0)
PLATELETS: 253 10*3/uL (ref 150.0–400.0)
RBC: 4.85 Mil/uL (ref 4.22–5.81)
RDW: 15.3 % (ref 11.5–15.5)
WBC: 5.1 10*3/uL (ref 4.0–10.5)

## 2018-01-04 LAB — LIPID PANEL
CHOLESTEROL: 142 mg/dL (ref 0–200)
HDL: 38 mg/dL — ABNORMAL LOW (ref >39.00)
LDL Cholesterol: 86 mg/dL (ref 0–99)
NONHDL: 104.44
Total CHOL/HDL Ratio: 4
Triglycerides: 93 mg/dL (ref 0.0–149.0)
VLDL: 18.6 mg/dL (ref 0.0–40.0)

## 2018-01-04 LAB — TSH: TSH: 1.7 u[IU]/mL (ref 0.35–4.50)

## 2018-01-04 LAB — POCT URINALYSIS DIPSTICK
BILIRUBIN UA: NEGATIVE
Blood, UA: NEGATIVE
GLUCOSE UA: NEGATIVE
KETONES UA: NEGATIVE
Leukocytes, UA: NEGATIVE
Nitrite, UA: NEGATIVE
Spec Grav, UA: 1.02 (ref 1.010–1.025)
Urobilinogen, UA: 0.2 E.U./dL
pH, UA: 7 (ref 5.0–8.0)

## 2018-01-04 LAB — PSA: PSA: 1.56 ng/mL (ref 0.10–4.00)

## 2018-01-04 MED ORDER — ENALAPRIL MALEATE 20 MG PO TABS: ORAL_TABLET | ORAL | 4 refills | Status: DC

## 2018-01-04 MED ORDER — DOXAZOSIN MESYLATE 8 MG PO TABS: ORAL_TABLET | ORAL | 4 refills | Status: DC

## 2018-01-04 MED ORDER — OMEPRAZOLE 20 MG PO CPDR: DELAYED_RELEASE_CAPSULE | ORAL | 4 refills | Status: DC

## 2018-01-04 MED ORDER — FUROSEMIDE 40 MG PO TABS: ORAL_TABLET | ORAL | 4 refills | Status: DC

## 2018-01-04 NOTE — Patient Instructions (Signed)
Labs today......... I will call you if there is anything abnormal  Continue current meds  Contrast clarify the ring continue shingles vaccine  Return in one year for general physical examination sooner if any problems  Set a goal to lose 12 pounds in the next 12 months......... carbohydrate free diet  Say hello to your mom!!!!!!!!!!!!!!!!!!!!!!!!!

## 2018-01-04 NOTE — Progress Notes (Signed)
[name] is a 62 year old married male nonsmoker who comes in today for annual physical examination because of a history of hypertension, reflux esophagitis, and erectile dysfunction  Hypertension is been treated with Cardura 8 mg daily at bedtime, Vasotec 20 mg every morning, and Lasix 40 mg twice a day. BP today 120/88  His weight is 256 pounds unchanged. We've discussed diet exercise and weight loss every year for many years  He uses Viagra when necessary for ED  He takes Prilosec 20 mg daily because history of chronic reflux esophagitis  Vaccinations tetanus booster due today information given on shingles vaccine.  He gets routine eye care, dental care, colonoscopy 2018 was normal.  Social history........[name] single works for the city of [place]  14 point review of systems reviewed and otherwise negative.  EKG......WNL  BP 128/88 (BP Location: Left Arm, Patient Position: Sitting, Cuff Size: Large)   Pulse 78   Temp 98 F (36.7 C) (Oral)   Ht  (1.905 m)   Wt 256 lb (116.1 kg)   BMI 32.00 kg/m  Well-developed well-nourished male no acute distress vital signs stable he is afebrile HEENT were negative neck was supple thyroid not enlarged no carotid bruits. Cardiopulmonary exam normal. Abdominal exam normal. Genitalia normal circumcised male rectum normal. Guaiac negative prostate normal extremities normal skin no peripheral pulses normal except for scars right and left knee from previous knee replacements.  #1 hypertension at goal........ continue current meds check labs  #2 reflux esophagitis chronic........ continue Prilosec  #3 status post bilateral knee replacements......... continue Motrin 400 twice a day stop aspirin  #5 ED..........[name] refill generic Viagra

## 2018-03-06 DIAGNOSIS — H2513 Age-related nuclear cataract, bilateral: Secondary | ICD-10-CM | POA: Diagnosis not present

## 2018-07-17 DIAGNOSIS — Z23 Encounter for immunization: Secondary | ICD-10-CM | POA: Diagnosis not present

## 2018-08-24 ENCOUNTER — Telehealth: Payer: Self-pay | Admitting: Adult Health

## 2018-08-24 NOTE — Telephone Encounter (Signed)
Copied from CRM [id number]. Topic: Quick Communication - Rx Refill/Question >> Aug 24, 2018 10:34 AM Angela NevinWilliams, Candice N wrote: Medication: sildenafil (REVATIO) 20 MG tablet AND sildenafil (VIAGRA) 100 MG tablet  Patient is requesting refills of these medications. Patient is asking if they can be printed out, if possible. Please advise.

## 2018-08-25 NOTE — Telephone Encounter (Signed)
FYI Spoke to the patient and he is aware that Dr Tawanna Coolerodd is no longer at this office and that Kandee KeenCory is not available.  Patient would like to pick up the printed prescriptions when he comes in for his office visit 09/05/18.

## 2018-09-05 ENCOUNTER — Encounter: Payer: Self-pay | Admitting: Adult Health

## 2018-09-05 ENCOUNTER — Ambulatory Visit: Payer: 59 | Admitting: Adult Health

## 2018-09-05 VITALS — BP 140/98 | Temp 97.6°F | Wt 257.0 lb

## 2018-09-05 DIAGNOSIS — Z7689 Persons encountering health services in other specified circumstances: Secondary | ICD-10-CM

## 2018-09-05 DIAGNOSIS — M150 Primary generalized (osteo)arthritis: Secondary | ICD-10-CM

## 2018-09-05 DIAGNOSIS — N521 Erectile dysfunction due to diseases classified elsewhere: Secondary | ICD-10-CM | POA: Diagnosis not present

## 2018-09-05 DIAGNOSIS — I10 Essential (primary) hypertension: Secondary | ICD-10-CM | POA: Diagnosis not present

## 2018-09-05 DIAGNOSIS — M8949 Other hypertrophic osteoarthropathy, multiple sites: Secondary | ICD-10-CM

## 2018-09-05 DIAGNOSIS — M159 Polyosteoarthritis, unspecified: Secondary | ICD-10-CM

## 2018-09-05 MED ORDER — SILDENAFIL CITRATE 20 MG PO TABS: ORAL_TABLET | ORAL | 0 refills | Status: DC

## 2018-09-05 MED ORDER — MELOXICAM 7.5 MG PO TABS: 7.5000 mg | ORAL_TABLET | Freq: Every day | ORAL | 0 refills | Status: DC

## 2018-09-05 MED ORDER — SILDENAFIL CITRATE 100 MG PO TABS: 50.0000 mg | ORAL_TABLET | Freq: Every day | ORAL | 10 refills | Status: DC | PRN

## 2018-09-05 NOTE — Progress Notes (Signed)
Patient presents to clinic today to establish care. He is a pleasant 62 year old male who  has a past medical history of Arthritis, GERD (gastroesophageal reflux disease), and Hypertension.  He is a former patient of Dr. Tawanna Coolerodd who last had his CPE in 01/2018   Acute Concerns: Establish Care   Chronic Issues: HTN- Takes Cardura 8 mg at bedtime, Vasotec 20 mg in the morning, and Lasix 40 mg twice daily. BP Readings from Last 3 Encounters:  09/05/18 (!) 140/98  01/04/18 128/88  04/19/17 126/77   GERD -controlled with Prilosec 40 mg daily  ED- Takes Viagra as needed  Osteoarthritis - Takes Motrin 600 mg BID PRN. Is also [facility]. He also sees Dr. Elita Quickowen at [facility] s/p knee replacements x 3.   Health Maintenance: Dental -- Routine  Vision -- Routine  Immunizations --  utd  Colonoscopy -- UTD   Past Medical History:  Diagnosis Date  . Arthritis   . GERD (gastroesophageal reflux disease)   . Hypertension     Past Surgical History:  Procedure Laterality Date  . MEDIAL PARTIAL KNEE REPLACEMENT Bilateral   . SHOULDER SURGERY Left     Current Outpatient Medications on File Prior to Visit  Medication Sig Dispense Refill  . doxazosin (CARDURA) 8 MG tablet TAKE 1 TABLET (8 MG TOTAL) BY MOUTH AT BEDTIME. 90 tablet 4  . enalapril (VASOTEC) 20 MG tablet TAKE 1 TABLET (20 MG TOTAL) BY MOUTH DAILY. 90 tablet 4  . furosemide (LASIX) 40 MG tablet TAKE 1 TABLET (40 MG TOTAL) BY MOUTH 2 (TWO) TIMES DAILY. 200 tablet 4  . ibuprofen (ADVIL,MOTRIN) 200 MG tablet Take 600 mg by mouth 2 (two) times daily as needed. Using for shoulder and back pain    . Multiple Vitamin (MULTIVITAMIN) tablet Take 1 tablet by mouth daily.    [name]. omeprazole (PRILOSEC) 20 MG capsule 1 by mouth every morning 100 capsule 4  . sildenafil (REVATIO) 20 MG tablet TAKE 1 TO 2 TABLETS BY MOUTH 2 TO 3 HOURS BEFORE SEX 30 tablet 10   Current Facility-Administered Medications on File Prior to  Visit  Medication Dose Route Frequency Provider Last Rate Last Dose  . 0.9 %  sodium chloride infusion  500 mL Intravenous Continuous Nandigam, Eleonore ChiquitoKavitha V, MD        Allergies  Allergen Reactions  . Morphine Nausea And Vomiting    Family History  Problem Relation Age of Onset  . Colon polyps Neg Hx   . Rectal cancer Neg Hx   . Stomach cancer Neg Hx   . Esophageal cancer Neg Hx     Social History   Socioeconomic History  . Marital status: Married    Spouse name: Not on file  . Number of children: Not on file  . Years of education: Not on file  . Highest education level: Not on file  Occupational History  . Not on file  Social Needs  . Financial resource strain: Not on file  . Food insecurity:    Worry: Not on file    Inability: Not on file  . Transportation needs:    Medical: Not on file    Non-medical: Not on file  Tobacco Use  . Smoking status: Former Smoker    Last attempt to quit: 01/12/1991    Years since quitting: 27.6  . Smokeless tobacco: Never Used  Substance and Sexual Activity  . Alcohol use: No  . Drug use: No  .  Sexual activity: Not on file  Lifestyle  . Physical activity:    Days per week: Not on file    Minutes per session: Not on file  . Stress: Not on file  Relationships  . Social connections:    Talks on phone: Not on file    Gets together: Not on file    Attends religious service: Not on file    Active member of club or organization: Not on file    Attends meetings of clubs or organizations: Not on file    Relationship status: Not on file  . Intimate partner violence:    Fear of current or ex partner: Not on file    Emotionally abused: Not on file    Physically abused: Not on file    Forced sexual activity: Not on file  Other Topics Concern  . Not on file  Social History Narrative  . Not on file    Review of Systems  Constitutional: Negative.   HENT: Negative.   Eyes: Negative.   Respiratory: Negative.   Cardiovascular:  Negative.   Gastrointestinal: Negative.   Genitourinary: Negative.   Musculoskeletal: Positive for back pain and joint pain.  Skin: Negative.   Neurological: Negative.   Endo/Heme/Allergies: Negative.   Psychiatric/Behavioral: Negative.   All other systems reviewed and are negative.     BP (!) 140/98   Temp 97.6 F (36.4 C)   Wt 257 lb (116.6 kg)   BMI 32.12 kg/m   Physical Exam Vitals signs and nursing note reviewed.  Constitutional:      General: He is not in acute distress.    Appearance: Normal appearance. He is well-developed and normal weight.  HENT:     Head: Normocephalic and atraumatic.     Right Ear: Tympanic membrane, ear canal and external ear normal.     Left Ear: Tympanic membrane, ear canal and external ear normal.     Nose: Nose normal.     Mouth/Throat:     Mouth: Mucous membranes are moist.     Pharynx: Oropharynx is clear.  Eyes:     General:        Right eye: No discharge.        Left eye: No discharge.     Conjunctiva/sclera: Conjunctivae normal.     Pupils: Pupils are equal, round, and reactive to light.  Neck:     Thyroid: No thyromegaly.     Trachea: No tracheal deviation.  Cardiovascular:     Rate and Rhythm: Normal rate and regular rhythm.     Pulses: Normal pulses.     Heart sounds: Normal heart sounds. No murmur. No friction rub. No gallop.   Pulmonary:     Effort: Pulmonary effort is normal. No respiratory distress.     Breath sounds: Normal breath sounds. No wheezing or rales.  Musculoskeletal: Normal range of motion.  Skin:    General: Skin is warm and dry.  Neurological:     General: No focal deficit present.     Mental Status: He is alert and oriented to person, place, and time.     Cranial Nerves: No cranial nerve deficit.     Sensory: No sensory deficit.     Motor: No weakness.     Coordination: Coordination normal.     Gait: Gait normal.     Deep Tendon Reflexes: Reflexes normal.  Psychiatric:        Mood and Affect:  Mood normal.  Behavior: Behavior normal.        Thought Content: Thought content normal.        Judgment: Judgment normal.    Assessment/Plan: 1. Encounter to establish care - Follow up in May for CPE or sooner if needed  2. Primary osteoarthritis involving multiple joints - Will switch from Motrin to Mobic. Advised not to take any other NSAIDS while taking Mobic - meloxicam (MOBIC) 7.5 MG tablet; Take 1 tablet (7.5 mg total) by mouth daily.  Dispense: 90 tablet; Refill: 0  3. Erectile dysfunction due to diseases classified elsewhere  - sildenafil (REVATIO) 20 MG tablet; TAKE 1 TO 2 TABLETS BY MOUTH 2 TO 3 HOURS BEFORE SEX  Dispense: 30 tablet; Refill: 0 - sildenafil (VIAGRA) 100 MG tablet; Take 0.5-1 tablets (50-100 mg total) by mouth daily as needed for erectile dysfunction.  Dispense: 10 tablet; Refill: 10  4. Essential hypertension - Well controlled in the past. Not at goal today. Will continue to onitor    Shirline Freesory Devynn Hessler, NP

## 2018-09-15 DIAGNOSIS — Z23 Encounter for immunization: Secondary | ICD-10-CM | POA: Diagnosis not present

## 2018-09-29 ENCOUNTER — Telehealth: Payer: Self-pay

## 2018-09-29 MED ORDER — MELOXICAM 15 MG PO TABS: 15.0000 mg | ORAL_TABLET | Freq: Every day | ORAL | 1 refills | Status: DC

## 2018-09-29 NOTE — Telephone Encounter (Signed)
Ok to increase Mobic to 15 mg daily. 90 days + 1

## 2018-09-29 NOTE — Telephone Encounter (Signed)
Rx done and I called the pt and informed him of this. 

## 2018-09-29 NOTE — Telephone Encounter (Signed)
Copied from CRM [id number]. Topic: General - Other >> Sep 29, 2018 10:14 AM Floria RavelingStovall, Shana A wrote: Reason for CRM:   Pt called in and stated that she would like to know if Kandee KeenCory can up the dose on the meloxicam (MOBIC) 7.5 MG tablet [[id number]][[id number]].  He is stating that it is not helping very much.  He would like a new script called in with the new dosage. Pt is using this for back pain.   Pharmacy[id number]- [place], [place] - [address] [phone] (Phone)

## 2018-11-01 ENCOUNTER — Other Ambulatory Visit: Payer: Self-pay | Admitting: Adult Health

## 2018-11-01 ENCOUNTER — Telehealth: Payer: Self-pay | Admitting: Adult Health

## 2018-11-01 DIAGNOSIS — N521 Erectile dysfunction due to diseases classified elsewhere: Secondary | ICD-10-CM

## 2018-11-01 MED ORDER — SILDENAFIL CITRATE 100 MG PO TABS: 50.0000 mg | ORAL_TABLET | Freq: Every day | ORAL | 0 refills | Status: DC | PRN

## 2018-11-01 MED ORDER — SILDENAFIL CITRATE 20 MG PO TABS: ORAL_TABLET | ORAL | 0 refills | Status: DC

## 2018-11-01 NOTE — Telephone Encounter (Signed)
Copied from CRM [id number]. Topic: Quick Communication - Rx Refill/Question >> Nov 01, 2018 10:04 AM Floria Raveling A wrote: Reason for CRM:   Pt called in and state that he would like to know if Kandee Keen could hand write a script for  sildenafil (VIAGRA) 100 MG tablet [[id number]]  for 100 pills with refills.  He stated that his insurance will cover the the 100 pills and would be much cheaper for him.  He would like to pick up script from office if is this is able to be done?  Please advise   [phone]

## 2018-11-01 NOTE — Telephone Encounter (Signed)
Pt called to check on status Rx printed, signed and placed up front to be picked up.  Nothing further needed.

## 2018-11-01 NOTE — Telephone Encounter (Signed)
Ok to print off prescription

## 2018-11-01 NOTE — Addendum Note (Signed)
Addended by: Raj Janus T on: 11/01/2018 03:34 PM   Modules accepted: Orders

## 2018-11-01 NOTE — Telephone Encounter (Signed)
Prescription fixed.  Sent to [facility].  Nothing further needed.

## 2019-01-01 ENCOUNTER — Other Ambulatory Visit: Payer: Self-pay | Admitting: Adult Health

## 2019-01-01 DIAGNOSIS — N521 Erectile dysfunction due to diseases classified elsewhere: Secondary | ICD-10-CM

## 2019-01-01 MED ORDER — SILDENAFIL CITRATE 20 MG PO TABS: ORAL_TABLET | ORAL | 0 refills | Status: DC

## 2019-01-01 NOTE — Telephone Encounter (Signed)
Copied from CRM [id number]. Topic: Quick Communication - Rx Refill/Question >> Jan 01, 2019 11:03 AM Burchel, Abbi R wrote: Medication: sildenafil (REVATIO) 20 MG tablet (100 tablets)  Preferred Pharmacy:  [facility] [address] [place] - [address]  [phone] (Phone) [phone] (Fax)

## 2019-01-01 NOTE — Telephone Encounter (Signed)
Received a Skype message from [facility] CMA that medication was sent to wrong pharmacy.  Medication refill sent to [facility].

## 2019-01-01 NOTE — Telephone Encounter (Signed)
Filled by [facility] nurse, Lonia Farber, RN.  Advised her this medication was sent to the wrong pharmacy.  She will resend to correct pharmacy and cancel wrong prescription.

## 2019-01-01 NOTE — Telephone Encounter (Signed)
Requested Prescriptions  Pending Prescriptions Disp Refills  . sildenafil (REVATIO) 20 MG tablet 100 tablet 0    Sig: TAKE 1 TO 2 TABLETS BY MOUTH 2 TO 3 HOURS BEFORE SEX     Urology: Erectile Dysfunction Agents Failed - 01/01/2019  8:47 AM      Failed - Last BP in normal range    BP Readings from Last 1 Encounters:  09/05/18 (!) 140/98         Passed - Valid encounter within last 12 months    Recent Outpatient Visits          3 months ago Primary osteoarthritis involving multiple joints   Nature conservation officer at [facility], [place], NP   12 months ago Essential hypertension   Nature conservation officer at [facility], Eugenio Hoes, MD   1 year ago Low blood monocyte count   Nature conservation officer at [facility], Eugenio Hoes, MD   2 years ago Essential hypertension   Nature conservation officer at [facility], Eugenio Hoes, MD   2 years ago Cystitis   Nature conservation officer at [facility], [place], FNP      Future Appointments            In 1 week Nafziger, Kandee Keen, NP [facility] [place], [facility]

## 2019-01-09 ENCOUNTER — Encounter: Payer: 59 | Admitting: Adult Health

## 2019-01-17 ENCOUNTER — Other Ambulatory Visit: Payer: Self-pay | Admitting: Family Medicine

## 2019-01-17 MED ORDER — ENALAPRIL MALEATE 20 MG PO TABS: ORAL_TABLET | ORAL | 0 refills | Status: DC

## 2019-01-17 NOTE — Telephone Encounter (Signed)
Sent to the pharmacy by e-scribe. 

## 2019-02-09 ENCOUNTER — Telehealth: Payer: Self-pay | Admitting: Adult Health

## 2019-02-09 DIAGNOSIS — I10 Essential (primary) hypertension: Secondary | ICD-10-CM

## 2019-02-13 MED ORDER — DOXAZOSIN MESYLATE 8 MG PO TABS: ORAL_TABLET | ORAL | 0 refills | Status: DC

## 2019-02-13 NOTE — Telephone Encounter (Signed)
Pt states he also needs doxazosin (CARDURA) 8 MG tablet  Along with the  furosemide (LASIX) 40 MG tablet  omeprazole (PRILOSEC) 20 MG capsule  [id number] [address] [place] [facility] [place]. [phone] (Phone) [phone] (Fax)   This sent 02/09/19.  Pt has appt for cpe 04/04/2019

## 2019-02-13 NOTE — Telephone Encounter (Signed)
Sent to the pharmacy by e-scribe. 

## 2019-03-05 ENCOUNTER — Other Ambulatory Visit: Payer: Self-pay | Admitting: Adult Health

## 2019-03-05 DIAGNOSIS — N521 Erectile dysfunction due to diseases classified elsewhere: Secondary | ICD-10-CM

## 2019-03-21 ENCOUNTER — Other Ambulatory Visit: Payer: Self-pay | Admitting: Adult Health

## 2019-03-22 NOTE — Telephone Encounter (Signed)
Sent to the pharmacy by e-scribe for 30 days.  Has upcoming cpx.

## 2019-03-30 ENCOUNTER — Other Ambulatory Visit: Payer: Self-pay | Admitting: Adult Health

## 2019-04-04 ENCOUNTER — Encounter: Payer: Self-pay | Admitting: Adult Health

## 2019-04-04 ENCOUNTER — Ambulatory Visit (INDEPENDENT_AMBULATORY_CARE_PROVIDER_SITE_OTHER): Payer: 59 | Admitting: Adult Health

## 2019-04-04 ENCOUNTER — Other Ambulatory Visit: Payer: Self-pay

## 2019-04-04 VITALS — BP 128/92 | Temp 98.4°F | Ht 74.5 in | Wt 251.0 lb

## 2019-04-04 DIAGNOSIS — Z1159 Encounter for screening for other viral diseases: Secondary | ICD-10-CM

## 2019-04-04 DIAGNOSIS — M150 Primary generalized (osteo)arthritis: Secondary | ICD-10-CM

## 2019-04-04 DIAGNOSIS — N521 Erectile dysfunction due to diseases classified elsewhere: Secondary | ICD-10-CM

## 2019-04-04 DIAGNOSIS — M159 Polyosteoarthritis, unspecified: Secondary | ICD-10-CM

## 2019-04-04 DIAGNOSIS — M8949 Other hypertrophic osteoarthropathy, multiple sites: Secondary | ICD-10-CM

## 2019-04-04 DIAGNOSIS — Z125 Encounter for screening for malignant neoplasm of prostate: Secondary | ICD-10-CM | POA: Diagnosis not present

## 2019-04-04 DIAGNOSIS — I10 Essential (primary) hypertension: Secondary | ICD-10-CM | POA: Diagnosis not present

## 2019-04-04 DIAGNOSIS — Z0000 Encounter for general adult medical examination without abnormal findings: Secondary | ICD-10-CM

## 2019-04-04 DIAGNOSIS — Z114 Encounter for screening for human immunodeficiency virus [HIV]: Secondary | ICD-10-CM

## 2019-04-04 LAB — COMPREHENSIVE METABOLIC PANEL
ALT: 18 U/L (ref 0–53)
AST: 18 U/L (ref 0–37)
Albumin: 3.9 g/dL (ref 3.5–5.2)
Alkaline Phosphatase: 71 U/L (ref 39–117)
BUN: 14 mg/dL (ref 6–23)
CO2: 28 mEq/L (ref 19–32)
Calcium: 9.3 mg/dL (ref 8.4–10.5)
Chloride: 107 mEq/L (ref 96–112)
Creatinine, Ser: 0.95 mg/dL (ref 0.40–1.50)
GFR: 96.99 mL/min (ref >60.00)
Glucose, Bld: 96 mg/dL (ref 70–99)
Potassium: 4.3 mEq/L (ref 3.5–5.1)
Sodium: 140 mEq/L (ref 135–145)
Total Bilirubin: 0.5 mg/dL (ref 0.2–1.2)
Total Protein: 7 g/dL (ref 6.0–8.3)

## 2019-04-04 LAB — CBC WITH DIFFERENTIAL/PLATELET
Basophils Absolute: 0.1 10*3/uL (ref 0.0–0.1)
Basophils Relative: 1.2 % (ref 0.0–3.0)
Eosinophils Absolute: 0.2 10*3/uL (ref 0.0–0.7)
Eosinophils Relative: 4.2 % (ref 0.0–5.0)
HCT: 39.7 % (ref 39.0–52.0)
Hemoglobin: 12.7 g/dL — ABNORMAL LOW (ref 13.0–17.0)
Lymphocytes Relative: 29.4 % (ref 12.0–46.0)
Lymphs Abs: 1.5 10*3/uL (ref 0.7–4.0)
MCHC: 31.9 g/dL (ref 30.0–36.0)
MCV: 81.9 fl (ref 78.0–100.0)
Monocytes Absolute: 0.4 10*3/uL (ref 0.1–1.0)
Monocytes Relative: 7 % (ref 3.0–12.0)
Neutro Abs: 3 10*3/uL (ref 1.4–7.7)
Neutrophils Relative %: 58.2 % (ref 43.0–77.0)
Platelets: 304 10*3/uL (ref 150.0–400.0)
RBC: 4.85 Mil/uL (ref 4.22–5.81)
RDW: 15.1 % (ref 11.5–15.5)
WBC: 5.1 10*3/uL (ref 4.0–10.5)

## 2019-04-04 LAB — TSH: TSH: 1.51 u[IU]/mL (ref 0.35–4.50)

## 2019-04-04 LAB — LIPID PANEL
Cholesterol: 139 mg/dL (ref 0–200)
HDL: 44.8 mg/dL (ref >39.00)
LDL Cholesterol: 77 mg/dL (ref 0–99)
NonHDL: 93.82
Total CHOL/HDL Ratio: 3
Triglycerides: 86 mg/dL (ref 0.0–149.0)
VLDL: 17.2 mg/dL (ref 0.0–40.0)

## 2019-04-04 LAB — PSA: PSA: 2.3 ng/mL (ref 0.10–4.00)

## 2019-04-04 NOTE — Progress Notes (Signed)
Subjective:    Patient ID: [name], male    DOB: [birth year], 63 y.o.   MRN: [id number]  HPI  Patient presents for yearly preventative medicine examination. Pleasant 63 year old male who  has a past medical history of Arthritis, GERD (gastroesophageal reflux disease), and Hypertension.  HTN -Cardura 8 mg nightly, Vasotec 20 mg daily, and Lasix 40 mg twice daily.  He denies chest pain, shortness of breath, dizziness, lightheadedness, or syncopal episodes.  BP Readings from Last 3 Encounters:  04/04/19 (!) 128/92  09/05/18 (!) 140/98  01/04/18 128/88   GERD -controlled well with Prilosec 40 mg daily  ED-takes Viagra PRN  Osteoarthritis low back/left knee/left shoulder-pretty well controlled with Motrin 600 mg twice daily as needed.  All immunizations and health maintenance protocols were reviewed with the patient and needed orders were placed. UTD on Vaccinations   Appropriate screening laboratory values were ordered for the patient including screening of hyperlipidemia, renal function and hepatic function. If indicated by BPH, a PSA was ordered.  Medication reconciliation,  past medical history, social history, problem list and allergies were reviewed in detail with the patient  Goals were established with regard to weight loss, exercise, and  diet in compliance with medications  End of life planning was discussed.  He is up-to-date on routine dental and vision screens as well as screening colonoscopy.  Review of Systems  Constitutional: Negative.   HENT: Negative.   Eyes: Negative.   Respiratory: Negative.   Cardiovascular: Negative.   Gastrointestinal: Negative.   Endocrine: Negative.   Genitourinary: Negative.   Musculoskeletal: Positive for arthralgias and back pain.  Skin: Negative.   Allergic/Immunologic: Negative.   Neurological: Negative.   Hematological: Negative.   Psychiatric/Behavioral: Negative.   All other systems reviewed and are negative.   Past Medical History:  Diagnosis Date  . Arthritis   . GERD (gastroesophageal reflux disease)   . Hypertension     Social History   Socioeconomic History  . Marital status: Married    Spouse name: Not on file  . Number of children: Not on file  . Years of education: Not on file  . Highest education level: Not on file  Occupational History  . Not on file  Social Needs  . Financial resource strain: Not on file  . Food insecurity    Worry: Not on file    Inability: Not on file  . Transportation needs    Medical: Not on file    Non-medical: Not on file  Tobacco Use  . Smoking status: Former Smoker    Quit date: 01/12/1991    Years since quitting: 28.2  . Smokeless tobacco: Never Used  Substance and Sexual Activity  . Alcohol use: No  . Drug use: No  . Sexual activity: Not on file  Lifestyle  . Physical activity    Days per week: Not on file    Minutes per session: Not on file  . Stress: Not on file  Relationships  . Social Musicianconnections    Talks on phone: Not on file    Gets together: Not on file    Attends religious service: Not on file    Active member of club or organization: Not on file    Attends meetings of clubs or organizations: Not on file    Relationship status: Not on file  . Intimate partner violence    Fear of current or ex partner: Not on file    Emotionally abused: Not on  file    Physically abused: Not on file    Forced sexual activity: Not on file  Other Topics Concern  . Not on file  Social History Narrative  . Not on file    Past Surgical History:  Procedure Laterality Date  . MEDIAL PARTIAL KNEE REPLACEMENT Bilateral   . SHOULDER SURGERY Left     Family History  Problem Relation Age of Onset  . Colon polyps Neg Hx   . Rectal cancer Neg Hx   . Stomach cancer Neg Hx   . Esophageal cancer Neg Hx     Allergies  Allergen Reactions  . Morphine Nausea And Vomiting    Current Outpatient Medications on File Prior to Visit  Medication  Sig Dispense Refill  . doxazosin (CARDURA) 8 MG tablet TAKE 1 TABLET (8 MG TOTAL) BY MOUTH AT BEDTIME. 90 tablet 0  . enalapril (VASOTEC) 20 MG tablet TAKE 1 TABLET (20 MG TOTAL) BY MOUTH DAILY. 90 tablet 0  . Ferrous Sulfate (IRON) 325 (65 Fe) MG TABS Take 1 tablet by mouth daily.    . furosemide (LASIX) 40 MG tablet TAKE 1 TABLET BY MOUTH TWICE A DAY 180 tablet 0  . ibuprofen (ADVIL,MOTRIN) 200 MG tablet Take 600 mg by mouth 2 (two) times daily as needed. Using for shoulder and back pain    . meloxicam (MOBIC) 15 MG tablet TAKE 1 TABLET BY MOUTH EVERY DAY 30 tablet 0  . Multiple Vitamins-Minerals (CENTRUM SILVER 50+MEN PO) Take 1 tablet by mouth daily.    [name]. omeprazole (PRILOSEC) 20 MG capsule TAKE 1 CAPSULE BY MOUTH EVERY DAY 90 capsule 0  . sildenafil (REVATIO) 20 MG tablet take 1 to 2 tablets by mouth 2 to 3 hours before sex 100 tablet 0   No current facility-administered medications on file prior to visit.     BP (!) 128/92   Temp 98.4 F (36.9 C)   Ht 6' 2.5" (1.892 m)   Wt 251 lb (113.9 kg)   BMI 31.80 kg/m       Objective:   Physical Exam Vitals signs and nursing note reviewed.  Constitutional:      General: He is not in acute distress.    Appearance: Normal appearance. He is obese. He is not ill-appearing, toxic-appearing or diaphoretic.  HENT:     Head: Normocephalic and atraumatic.     Right Ear: Tympanic membrane, ear canal and external ear normal. There is no impacted cerumen.     Left Ear: Tympanic membrane, ear canal and external ear normal. There is no impacted cerumen.     Nose: Nose normal. No congestion or rhinorrhea.     Mouth/Throat:     Mouth: Mucous membranes are moist.     Pharynx: Oropharynx is clear. No oropharyngeal exudate or posterior oropharyngeal erythema.  Eyes:     General: No scleral icterus.       Right eye: No discharge.        Left eye: No discharge.     Extraocular Movements: Extraocular movements intact.     Conjunctiva/sclera:  Conjunctivae normal.     Pupils: Pupils are equal, round, and reactive to light.  Neck:     Musculoskeletal: Normal range of motion and neck supple.     Thyroid: No thyromegaly.     Vascular: No JVD.     Trachea: No tracheal deviation.  Cardiovascular:     Rate and Rhythm: Normal rate and regular rhythm.     Pulses: Normal pulses.  Heart sounds: Normal heart sounds. No murmur. No friction rub. No gallop.   Pulmonary:     Effort: Pulmonary effort is normal. No respiratory distress.     Breath sounds: Normal breath sounds. No stridor. No wheezing, rhonchi or rales.  Chest:     Chest wall: No tenderness.  Abdominal:     General: Abdomen is flat. Bowel sounds are normal. There is no distension.     Palpations: Abdomen is soft. There is no mass.     Tenderness: There is no abdominal tenderness. There is no right CVA tenderness, left CVA tenderness, guarding or rebound.     Hernia: No hernia is present.  Musculoskeletal: Normal range of motion.        General: No swelling, tenderness, deformity or signs of injury.     Right lower leg: Edema (trace) present.     Left lower leg: Edema (trace) present.  Lymphadenopathy:     Cervical: No cervical adenopathy.  Skin:    General: Skin is warm and dry.     Capillary Refill: Capillary refill takes less than 2 seconds.     Coloration: Skin is not jaundiced or pale.     Findings: No bruising, erythema, lesion or rash.  Neurological:     General: No focal deficit present.     Mental Status: He is alert. Mental status is at baseline. He is disoriented.     Cranial Nerves: No cranial nerve deficit.     Sensory: No sensory deficit.     Motor: No weakness or abnormal muscle tone.     Coordination: Coordination normal.     Gait: Gait normal.     Deep Tendon Reflexes: Reflexes are normal and symmetric. Reflexes normal.  Psychiatric:        Mood and Affect: Mood normal.        Behavior: Behavior normal.        Thought Content: Thought content  normal.        Judgment: Judgment normal.       Assessment & Plan:  1. Routine general medical examination at a health care facility - Continue to stay active and exercise.  - Follow up in one year or sooner if needed  - CBC with Differential/Platelet - Comprehensive metabolic panel - Lipid panel - TSH  2. Essential hypertension - No change in medications  - CBC with Differential/Platelet - Comprehensive metabolic panel - Lipid panel - TSH  3. Erectile dysfunction due to diseases classified elsewhere - Silentio PRN - CBC with Differential/Platelet - Comprehensive metabolic panel - Lipid panel - TSH  4. Primary osteoarthritis involving multiple joints - Continue with Motrin PRN   5. Need for hepatitis C screening test  - Hep C Antibody  6. Encounter for screening for HIV  - HIV Antibody (routine testing w rflx)  7. Prostate cancer screening  - PSA  Dorothyann Peng, NP

## 2019-04-05 LAB — HEPATITIS C ANTIBODY
Hepatitis C Ab: NONREACTIVE
SIGNAL TO CUT-OFF: 0.11 (ref <1.00)

## 2019-04-05 LAB — HIV ANTIBODY (ROUTINE TESTING W REFLEX): HIV 1&2 Ab, 4th Generation: NONREACTIVE

## 2019-04-06 ENCOUNTER — Encounter: Payer: Self-pay | Admitting: Family Medicine

## 2019-04-19 ENCOUNTER — Other Ambulatory Visit: Payer: Self-pay | Admitting: Adult Health

## 2019-04-20 ENCOUNTER — Other Ambulatory Visit: Payer: Self-pay | Admitting: Adult Health

## 2019-04-20 DIAGNOSIS — N521 Erectile dysfunction due to diseases classified elsewhere: Secondary | ICD-10-CM

## 2019-04-24 NOTE — Telephone Encounter (Signed)
Sent to the pharmacy by e-scribe. 

## 2019-04-24 NOTE — Telephone Encounter (Signed)
Copied from [place] [id number]. Topic: General - Other >> Apr 24, 2019 10:51 AM Leward Quan A wrote: Reason for CRM: Patient called in to inquire of the refill on his meloxicam (MOBIC) 15 MG tablet asking can the Rx be sent to the pharmacy please with multiple refills please so he does not have to keep calling the office and the pharmacy over and over in order to receive his medicine. Please advise

## 2019-05-08 ENCOUNTER — Other Ambulatory Visit: Payer: Self-pay | Admitting: Adult Health

## 2019-05-08 DIAGNOSIS — I10 Essential (primary) hypertension: Secondary | ICD-10-CM

## 2019-05-09 ENCOUNTER — Other Ambulatory Visit: Payer: Self-pay | Admitting: Adult Health

## 2019-06-07 ENCOUNTER — Other Ambulatory Visit: Payer: Self-pay | Admitting: Adult Health

## 2019-06-08 NOTE — Telephone Encounter (Signed)
Sent to the pharmacy by e-scribe. 

## 2019-10-06 ENCOUNTER — Other Ambulatory Visit: Payer: Self-pay | Admitting: Adult Health

## 2020-01-09 ENCOUNTER — Other Ambulatory Visit: Payer: Self-pay | Admitting: Adult Health

## 2020-01-09 DIAGNOSIS — N521 Erectile dysfunction due to diseases classified elsewhere: Secondary | ICD-10-CM

## 2020-01-10 NOTE — Telephone Encounter (Signed)
Sent to the pharmacy by e-scribe. 

## 2020-02-11 ENCOUNTER — Telehealth: Payer: Self-pay | Admitting: Adult Health

## 2020-02-11 DIAGNOSIS — N521 Erectile dysfunction due to diseases classified elsewhere: Secondary | ICD-10-CM

## 2020-02-11 NOTE — Telephone Encounter (Signed)
[facility] called to see if we received a request to refill pt's prescription-sending message back to be sure  Medication Refill:  Revatio  Pharmacy:  [facility] Phone: [phone] FAX: [phone]

## 2020-02-12 NOTE — Telephone Encounter (Signed)
Left a message for a return call.  Previous refill sent to [facility].  Unaware that the pt is switching pharmacies.  Will need to confirm with the pt before sending in rx.

## 2020-02-13 NOTE — Telephone Encounter (Signed)
Left a message for a return call.

## 2020-02-19 MED ORDER — SILDENAFIL CITRATE 20 MG PO TABS: ORAL_TABLET | ORAL | 0 refills | Status: DC

## 2020-02-19 NOTE — Telephone Encounter (Signed)
Rx faxed to [facility].  Received confirmation the fax was successful.

## 2020-02-19 NOTE — Addendum Note (Signed)
Addended by: Raj Janus T on: 02/19/2020 03:05 PM   Modules accepted: Orders

## 2020-02-19 NOTE — Telephone Encounter (Signed)
Spoke to the pt.  He does not want his prescription sent to [facility].  He would like it sent to [facility].  Fax # [phone].  Printed for [employer] to sign and will then fax.  Nothing further needed.

## 2020-02-20 ENCOUNTER — Telehealth: Payer: Self-pay | Admitting: Adult Health

## 2020-02-20 DIAGNOSIS — N521 Erectile dysfunction due to diseases classified elsewhere: Secondary | ICD-10-CM

## 2020-02-20 NOTE — Telephone Encounter (Signed)
Pt is requesting 2 refills on the generic for Viagra 100 mg tablet. Pt uses 2 different pharmacies. Pt is requesting to speak to you because you are aware of where he sends his medications. Thanks

## 2020-02-21 MED ORDER — SILDENAFIL CITRATE 100 MG PO TABS
50.0000 mg | ORAL_TABLET | Freq: Every day | ORAL | 0 refills | Status: DC | PRN
Start: 2020-02-21 — End: 2020-02-26

## 2020-02-21 MED ORDER — SILDENAFIL CITRATE 20 MG PO TABS: ORAL_TABLET | ORAL | 0 refills | Status: DC

## 2020-02-21 NOTE — Telephone Encounter (Signed)
Patient called to see if [facility] had received the message form yesterday because he hasn't heard anything back from [place].

## 2020-02-21 NOTE — Telephone Encounter (Signed)
Spoke to the pt.  He would like a prescription for 100 mg sildenafil tabs to be sent to [facility] [facility] pharmacy.  Would also like a prescription for 20 mg sildenafil tabs to be sent to [facility].  Pt uses [facility] as a back up.  He states the [facility] pharmacy is slow.  Please advise.

## 2020-02-21 NOTE — Addendum Note (Signed)
Addended by: Raj Janus T on: 02/21/2020 04:31 PM   Modules accepted: Orders

## 2020-02-21 NOTE — Addendum Note (Signed)
Addended by: Raj Janus T on: 02/21/2020 04:52 PM   Modules accepted: Orders

## 2020-02-21 NOTE — Telephone Encounter (Signed)
That is fine  10 tabs to [facility] with 3 refills

## 2020-02-21 NOTE — Telephone Encounter (Signed)
Both prescriptions have been sent to the pharmacy.  Nothing further needed.

## 2020-02-21 NOTE — Telephone Encounter (Signed)
Ok to send in 100 mg tabs, take 0.5-1 tab 2-3 hours before sex. 100 tabs, 0 refills

## 2020-02-22 ENCOUNTER — Telehealth: Payer: Self-pay | Admitting: Adult Health

## 2020-02-22 NOTE — Telephone Encounter (Signed)
Pt is returning your call. Pt can be reached at [phone]. Thanks

## 2020-02-22 NOTE — Telephone Encounter (Signed)
Spoke to [employer].  Pt last picked up a quantity of one hundred 20 mg tabs on 01/10/20.  There is different pricing between 20 and 100 mg tabs.  See below.  [facility] Pricing   100mg  tabs - $98 without membership                        $85.99 with membership   20 mg tabs - $60.55 without membership            $51.10 with membership

## 2020-02-22 NOTE — Telephone Encounter (Signed)
Left a message for a return call.

## 2020-02-22 NOTE — Telephone Encounter (Signed)
Spoke to the pt.  He is requesting 100 tabs of the 20 mg sildenafil to be sent to [facility].  Must be 20 mg due to cost.  Pt said he is taking 5 tabs at a time to equal 100 mg.  He informed me that Dr. Tawanna Cooler use to send 100 tabs to [facility] and 100 tabs to [facility].  Also told me that he only gets 16 tabs from [facility] at a time.  I called [facility] and found out that the pt requested 30 tabs to be sent to him at a time and he will receive 30 tabs.  Will forward to [facility] for authorization.

## 2020-02-22 NOTE — Telephone Encounter (Signed)
I sent in 100 tabs to [facility] on 01/10/2020.   He is going to need to pick a pharmacy, I am not going to send 100 tabs to both [facility] and [facility] that makes no sense I will send in a short supply yo [facility] today but I do not feel comfortable sending in another 100 tabs at this time.  We can discuss this at his CPE if he would like

## 2020-02-22 NOTE — Telephone Encounter (Signed)
Pt wants a refill for Sildenafil sent for 100 tablets with refills for a year so he don't have to keep running to the store and calling the dr's off.  If patient needs to come in for this he will.  Pharmacy- [facility] on [address]

## 2020-02-22 NOTE — Telephone Encounter (Signed)
Spoke to the pt.  He has decided that he will stay with [facility] pharmacy.  He is getting 30 tabs from [facility].  They called him earlier today to tell him it will be ready to ship.  We sent 20 pills to [facility] pharmacy on 02/21/20.  Pt acknowledged getting 100 tabs in May.  States he has used them all.  He would like to discuss further at [facility].  Advised again that Kandee Keen will only be sending a prescription to 1 pharmacy.  Will forward as FYI.

## 2020-02-25 NOTE — Telephone Encounter (Signed)
Pt would like to have a call back in reference to the prescription it has been a mix up somewhere with it.

## 2020-02-26 MED ORDER — SILDENAFIL CITRATE 100 MG PO TABS: 50.0000 mg | ORAL_TABLET | Freq: Every day | ORAL | 0 refills | Status: DC | PRN

## 2020-02-26 NOTE — Addendum Note (Signed)
Addended by: Raj Janus T on: 02/26/2020 09:03 AM   Modules accepted: Orders

## 2020-02-26 NOTE — Telephone Encounter (Signed)
Spoke to the pt.  He stated [facility] did not receive prescription for sildenafil 100 mg.  Called and spoke to [facility] representative.  Prescription for 100 mg was not received.  Advised the representative to cancel the 20 mg rx.  Sent in 30 tabs as requested by the pt for 100 mg.  Nothing further needed.

## 2020-04-08 ENCOUNTER — Other Ambulatory Visit: Payer: Self-pay | Admitting: Adult Health

## 2020-04-08 ENCOUNTER — Ambulatory Visit (INDEPENDENT_AMBULATORY_CARE_PROVIDER_SITE_OTHER): Payer: 59 | Admitting: Adult Health

## 2020-04-08 ENCOUNTER — Other Ambulatory Visit: Payer: Self-pay

## 2020-04-08 ENCOUNTER — Encounter: Payer: Self-pay | Admitting: Adult Health

## 2020-04-08 VITALS — BP 124/82 | Temp 98.8°F | Ht 76.5 in | Wt 252.0 lb

## 2020-04-08 DIAGNOSIS — M159 Polyosteoarthritis, unspecified: Secondary | ICD-10-CM

## 2020-04-08 DIAGNOSIS — I10 Essential (primary) hypertension: Secondary | ICD-10-CM | POA: Diagnosis not present

## 2020-04-08 DIAGNOSIS — M8949 Other hypertrophic osteoarthropathy, multiple sites: Secondary | ICD-10-CM

## 2020-04-08 DIAGNOSIS — N521 Erectile dysfunction due to diseases classified elsewhere: Secondary | ICD-10-CM | POA: Diagnosis not present

## 2020-04-08 DIAGNOSIS — Z0000 Encounter for general adult medical examination without abnormal findings: Secondary | ICD-10-CM | POA: Diagnosis not present

## 2020-04-08 DIAGNOSIS — Z125 Encounter for screening for malignant neoplasm of prostate: Secondary | ICD-10-CM

## 2020-04-08 MED ORDER — SILDENAFIL CITRATE 100 MG PO TABS: 50.0000 mg | ORAL_TABLET | Freq: Every day | ORAL | 0 refills | Status: DC | PRN

## 2020-04-08 NOTE — Progress Notes (Signed)
Subjective:    Patient ID: [name], male    DOB: [birth year], 64 y.o.   MRN: [id number]  HPI  Patient presents for yearly preventative medicine examination. He is a pleasant 64 year old male who  has a past medical history of Arthritis, GERD (gastroesophageal reflux disease), and Hypertension.  HTN -currently prescribed Cardura 8 mg nightly, Vasotec 20 mg daily, Lasix 40 mg twice daily.  He denies chest pain, shortness of breath, dizziness, lightheadedness, or syncopal episodes.  GERD-controlled with Prilosec 40 mg daily  Erectile dysfunction-takes Viagra as needed  Osteoarthritis -mostly located in low back, left knee, left shoulder He has been getting steroid injection at [facility] and reports that this has been helpful.   All immunizations and health maintenance protocols were reviewed with the patient and needed orders were placed.  Appropriate screening laboratory values were ordered for the patient including screening of hyperlipidemia, renal function and hepatic function. If indicated by BPH, a PSA was ordered.  Medication reconciliation,  past medical history, social history, problem list and allergies were reviewed in detail with the patient  Goals were established with regard to weight loss, exercise, and  diet in compliance with medications Wt Readings from Last 3 Encounters:  04/08/20 252 lb (114.3 kg)  04/04/19 251 lb (113.9 kg)  09/05/18 257 lb (116.6 kg)    Review of Systems  Constitutional: Negative.   HENT: Negative.   Eyes: Negative.   Respiratory: Negative.   Cardiovascular: Negative.   Gastrointestinal: Negative.   Endocrine: Negative.   Genitourinary: Negative.   Musculoskeletal: Positive for arthralgias and back pain.  Skin: Negative.   Allergic/Immunologic: Negative.   Neurological: Negative.   Hematological: Negative.   Psychiatric/Behavioral: Negative.   All other systems reviewed and are negative.  Past Medical History:  Diagnosis Date  .  Arthritis   . GERD (gastroesophageal reflux disease)   . Hypertension     Social History   Socioeconomic History  . Marital status: Married    Spouse name: Not on file  . Number of children: Not on file  . Years of education: Not on file  . Highest education level: Not on file  Occupational History  . Not on file  Tobacco Use  . Smoking status: Former Smoker    Quit date: 01/12/1991    Years since quitting: 29.2  . Smokeless tobacco: Never Used  Substance and Sexual Activity  . Alcohol use: No  . Drug use: No  . Sexual activity: Not on file  Other Topics Concern  . Not on file  Social History Narrative  . Not on file   Social Determinants of Health   Financial Resource Strain:   . Difficulty of Paying Living Expenses:   Food Insecurity:   . Worried About Charity fundraiser in the Last Year:   . Arboriculturist in the Last Year:   Transportation Needs:   . Film/video editor (Medical):   [name] Lack of Transportation (Non-Medical):   Physical Activity:   . Days of Exercise per Week:   . Minutes of Exercise per Session:   Stress:   . Feeling of Stress :   Social Connections:   . Frequency of Communication with Friends and Family:   . Frequency of Social Gatherings with Friends and Family:   . Attends Religious Services:   . Active Member of Clubs or Organizations:   . Attends Archivist Meetings:   [name] Marital Status:   Intimate  Partner Violence:   . Fear of Current or Ex-Partner:   . Emotionally Abused:   [name] Physically Abused:   . Sexually Abused:     Past Surgical History:  Procedure Laterality Date  . MEDIAL PARTIAL KNEE REPLACEMENT Bilateral   . SHOULDER SURGERY Left     Family History  Problem Relation Age of Onset  . Colon polyps Neg Hx   . Rectal cancer Neg Hx   . Stomach cancer Neg Hx   . Esophageal cancer Neg Hx     Allergies  Allergen Reactions  . Morphine Nausea And Vomiting    Current Outpatient Medications on File Prior to  Visit  Medication Sig Dispense Refill  . Aspirin-Caffeine (BC FAST PAIN RELIEF ARTHRITIS PO) Take by mouth.    . doxazosin (CARDURA) 8 MG tablet TAKE 1 TABLET (8 MG TOTAL) BY MOUTH AT BEDTIME. 90 tablet 3  . enalapril (VASOTEC) 20 MG tablet TAKE 1 TABLET BY MOUTH EVERY DAY 90 tablet 3  . Ferrous Sulfate (IRON) 325 (65 Fe) MG TABS Take 1 tablet by mouth daily.    . furosemide (LASIX) 40 MG tablet TAKE 1 TABLET BY MOUTH TWICE A DAY 180 tablet 2  . ibuprofen (ADVIL,MOTRIN) 200 MG tablet Take 600 mg by mouth 2 (two) times daily as needed. Using for shoulder and back pain    . Multiple Vitamins-Minerals (CENTRUM SILVER 50+MEN PO) Take 1 tablet by mouth daily.    [name] omeprazole (PRILOSEC) 20 MG capsule TAKE 1 CAPSULE BY MOUTH EVERY DAY 90 capsule 3   No current facility-administered medications on file prior to visit.    BP 124/82   Temp 98.8 F (37.1 C)   Ht 6' 4.5" (1.943 m)   Wt 252 lb (114.3 kg)   BMI 30.27 kg/m       Objective:   Physical Exam Vitals and nursing note reviewed.  Constitutional:      General: He is not in acute distress.    Appearance: Normal appearance. He is well-developed and normal weight.  HENT:     Head: Normocephalic and atraumatic.     Right Ear: Tympanic membrane, ear canal and external ear normal. There is no impacted cerumen.     Left Ear: Tympanic membrane, ear canal and external ear normal. There is no impacted cerumen.     Nose: Nose normal. No congestion or rhinorrhea.     Mouth/Throat:     Mouth: Mucous membranes are moist.     Pharynx: Oropharynx is clear. No oropharyngeal exudate or posterior oropharyngeal erythema.  Eyes:     General:        Right eye: No discharge.        Left eye: No discharge.     Extraocular Movements: Extraocular movements intact.     Conjunctiva/sclera: Conjunctivae normal.     Pupils: Pupils are equal, round, and reactive to light.  Neck:     Vascular: No carotid bruit.     Trachea: No tracheal deviation.    Cardiovascular:     Rate and Rhythm: Normal rate and regular rhythm.     Pulses: Normal pulses.     Heart sounds: Normal heart sounds. No murmur heard.  No friction rub. No gallop.   Pulmonary:     Effort: Pulmonary effort is normal. No respiratory distress.     Breath sounds: Normal breath sounds. No stridor. No wheezing, rhonchi or rales.  Chest:     Chest wall: No tenderness.  Abdominal:  General: Bowel sounds are normal. There is no distension.     Palpations: Abdomen is soft. There is no mass.     Tenderness: There is no abdominal tenderness. There is no right CVA tenderness, left CVA tenderness, guarding or rebound.     Hernia: No hernia is present.  Musculoskeletal:        General: No swelling, tenderness, deformity or signs of injury. Normal range of motion.     Right lower leg: No edema.     Left lower leg: No edema.  Lymphadenopathy:     Cervical: No cervical adenopathy.  Skin:    General: Skin is warm and dry.     Capillary Refill: Capillary refill takes less than 2 seconds.     Coloration: Skin is not jaundiced or pale.     Findings: No bruising, erythema, lesion or rash.  Neurological:     General: No focal deficit present.     Mental Status: He is alert and oriented to person, place, and time.     Cranial Nerves: No cranial nerve deficit.     Sensory: No sensory deficit.     Motor: No weakness.     Coordination: Coordination normal.     Gait: Gait normal.     Deep Tendon Reflexes: Reflexes normal.  Psychiatric:        Mood and Affect: Mood normal.        Behavior: Behavior normal.        Thought Content: Thought content normal.        Judgment: Judgment normal.       Assessment & Plan:  1. Routine general medical examination at a health care facility - Needs to work on weight loss through diet and exercise - Follow up in one month or sooner if needed - CBC with Differential/Platelet; Future - Hemoglobin A1c; Future - Lipid panel; Future - TSH;  Future - PSA; Future - CMP with eGFR([facility]); Future - CMP with eGFR([facility]) - PSA - TSH - Lipid panel - Hemoglobin A1c - CBC with Differential/Platelet  2. Erectile dysfunction due to diseases classified elsewhere  - sildenafil (VIAGRA) 100 MG tablet; Take 0.5-1 tablets (50-100 mg total) by mouth daily as needed for erectile dysfunction.  Dispense: 90 tablet; Refill: 0  3. Essential hypertension - No change in BP meds - CBC with Differential/Platelet; Future - Hemoglobin A1c; Future - Lipid panel; Future - TSH; Future - PSA; Future - CMP with eGFR([facility]); Future - CMP with eGFR([facility]) - PSA - TSH - Lipid panel - Hemoglobin A1c - CBC with Differential/Platelet  4. Primary osteoarthritis involving multiple joints - Follow up with Orthopedics as directed  5. Prostate cancer screening  - PSA; Future - PSA  Dorothyann Peng, NP

## 2020-04-09 LAB — CBC WITH DIFFERENTIAL/PLATELET
Absolute Monocytes: 432 cells/uL (ref 200–950)
Basophils Absolute: 41 cells/uL (ref 0–200)
Basophils Relative: 0.9 %
Eosinophils Absolute: 203 cells/uL (ref 15–500)
Eosinophils Relative: 4.5 %
HCT: 43.2 % (ref 38.5–50.0)
Hemoglobin: 13.8 g/dL (ref 13.2–17.1)
Lymphs Abs: 1364 cells/uL (ref 850–3900)
MCH: 25.7 pg — ABNORMAL LOW (ref 27.0–33.0)
MCHC: 31.9 g/dL — ABNORMAL LOW (ref 32.0–36.0)
MCV: 80.3 fL (ref 80.0–100.0)
MPV: 9.7 fL (ref 7.5–12.5)
Monocytes Relative: 9.6 %
Neutro Abs: 2462 cells/uL (ref 1500–7800)
Neutrophils Relative %: 54.7 %
Platelets: 337 10*3/uL (ref 140–400)
RBC: 5.38 10*6/uL (ref 4.20–5.80)
RDW: 14.4 % (ref 11.0–15.0)
Total Lymphocyte: 30.3 %
WBC: 4.5 10*3/uL (ref 3.8–10.8)

## 2020-04-09 LAB — COMPLETE METABOLIC PANEL WITH GFR
AG Ratio: 1.2 (calc) (ref 1.0–2.5)
ALT: 17 U/L (ref 9–46)
AST: 17 U/L (ref 10–35)
Albumin: 3.8 g/dL (ref 3.6–5.1)
Alkaline phosphatase (APISO): 64 U/L (ref 35–144)
BUN: 14 mg/dL (ref 7–25)
CO2: 29 mmol/L (ref 20–32)
Calcium: 9.2 mg/dL (ref 8.6–10.3)
Chloride: 107 mmol/L (ref 98–110)
Creat: 1.1 mg/dL (ref 0.70–1.25)
GFR, Est African American: 82 mL/min/{1.73_m2} (ref >=60)
GFR, Est Non African American: 71 mL/min/{1.73_m2} (ref >=60)
Globulin: 3.1 g/dL (calc) (ref 1.9–3.7)
Glucose, Bld: 94 mg/dL (ref 65–99)
Potassium: 5 mmol/L (ref 3.5–5.3)
Sodium: 142 mmol/L (ref 135–146)
Total Bilirubin: 0.5 mg/dL (ref 0.2–1.2)
Total Protein: 6.9 g/dL (ref 6.1–8.1)

## 2020-04-09 LAB — LIPID PANEL
Cholesterol: 137 mg/dL (ref <200)
HDL: 43 mg/dL (ref >=40)
LDL Cholesterol (Calc): 79 mg/dL (calc)
Non-HDL Cholesterol (Calc): 94 mg/dL (calc) (ref <130)
Total CHOL/HDL Ratio: 3.2 (calc) (ref <5.0)
Triglycerides: 74 mg/dL (ref <150)

## 2020-04-09 LAB — PSA: PSA: 1.8 ng/mL (ref <=4.0)

## 2020-04-09 LAB — HEMOGLOBIN A1C
Hgb A1c MFr Bld: 5.9 % of total Hgb — ABNORMAL HIGH (ref <5.7)
Mean Plasma Glucose: 123 (calc)
eAG (mmol/L): 6.8 (calc)

## 2020-04-09 LAB — TSH: TSH: 1.88 mIU/L (ref 0.40–4.50)

## 2020-04-12 ENCOUNTER — Other Ambulatory Visit: Payer: Self-pay | Admitting: Adult Health

## 2020-04-14 NOTE — Telephone Encounter (Signed)
SENT TO THE PHARMACY BY E-SCRIBE. 

## 2020-05-06 ENCOUNTER — Other Ambulatory Visit: Payer: Self-pay | Admitting: Adult Health

## 2020-05-13 ENCOUNTER — Other Ambulatory Visit: Payer: Self-pay | Admitting: Adult Health

## 2020-05-13 DIAGNOSIS — I10 Essential (primary) hypertension: Secondary | ICD-10-CM

## 2020-09-13 ENCOUNTER — Other Ambulatory Visit: Payer: Self-pay | Admitting: Adult Health

## 2020-09-13 DIAGNOSIS — N521 Erectile dysfunction due to diseases classified elsewhere: Secondary | ICD-10-CM

## 2020-09-17 ENCOUNTER — Telehealth: Payer: Self-pay | Admitting: Adult Health

## 2020-09-17 NOTE — Telephone Encounter (Signed)
No longer needed

## 2020-10-14 ENCOUNTER — Telehealth: Payer: Self-pay | Admitting: Adult Health

## 2020-10-14 NOTE — Telephone Encounter (Signed)
LEFT A MESSAGE FOR A RETURN CALL

## 2020-10-14 NOTE — Telephone Encounter (Signed)
Noted. See other note.

## 2020-10-14 NOTE — Telephone Encounter (Signed)
Spoke to the pt.  He has a pea size knot on his testicle.  It was smaller but has been growing.  Advised that he will need to be seen in the office.  I scheduled the pt for 10/16/20 @ 10:30.  Nothing further needed.

## 2020-10-14 NOTE — Telephone Encounter (Signed)
The patient is wanting to know if he needs to see Kandee Keen to get a referral to see a Urologist?  He would like for Cory's nurse to call him back.  Please advise

## 2020-10-14 NOTE — Telephone Encounter (Signed)
I advised the patient that normally he has to have an appointment with his provider to get a referral to a specialist.   He would still like to have a call back.

## 2020-10-15 ENCOUNTER — Other Ambulatory Visit: Payer: Self-pay

## 2020-10-16 ENCOUNTER — Telehealth: Payer: Self-pay | Admitting: Adult Health

## 2020-10-16 ENCOUNTER — Encounter: Payer: Self-pay | Admitting: Adult Health

## 2020-10-16 ENCOUNTER — Ambulatory Visit (INDEPENDENT_AMBULATORY_CARE_PROVIDER_SITE_OTHER): Payer: 59 | Admitting: Adult Health

## 2020-10-16 VITALS — BP 122/86 | Temp 97.9°F | Wt 263.0 lb

## 2020-10-16 DIAGNOSIS — N5089 Other specified disorders of the male genital organs: Secondary | ICD-10-CM | POA: Diagnosis not present

## 2020-10-16 NOTE — Addendum Note (Signed)
Addended by: Raj Janus T on: 10/16/2020 04:26 PM   Modules accepted: Orders

## 2020-10-16 NOTE — Telephone Encounter (Signed)
[facility] is calling in needing a new order if the pt is needing to have a doppler done on his scrotum.

## 2020-10-16 NOTE — Progress Notes (Signed)
Subjective:    Patient ID: [name], male    DOB: 01-16-1956, 65 y.o.   MRN: [id number]  HPI 65 year old male who  has a past medical history of Arthritis, GERD (gastroesophageal reflux disease), and Hypertension.  He presents to the office today for an acute concern. He reports a " pea sized mass next to my testicle"  Unsure when he first noticed. Denies pain or issues with urination     Review of Systems See HPI   Past Medical History:  Diagnosis Date  . Arthritis   . GERD (gastroesophageal reflux disease)   . Hypertension     Social History   Socioeconomic History  . Marital status: Married    Spouse name: Not on file  . Number of children: Not on file  . Years of education: Not on file  . Highest education level: Not on file  Occupational History  . Not on file  Tobacco Use  . Smoking status: Former Smoker    Quit date: 01/12/1991    Years since quitting: 29.7  . Smokeless tobacco: Never Used  Substance and Sexual Activity  . Alcohol use: No  . Drug use: No  . Sexual activity: Not on file  Other Topics Concern  . Not on file  Social History Narrative  . Not on file   Social Determinants of Health   Financial Resource Strain: Not on file  Food Insecurity: Not on file  Transportation Needs: Not on file  Physical Activity: Not on file  Stress: Not on file  Social Connections: Not on file  Intimate Partner Violence: Not on file    Past Surgical History:  Procedure Laterality Date  . MEDIAL PARTIAL KNEE REPLACEMENT Bilateral   . SHOULDER SURGERY Left     Family History  Problem Relation Age of Onset  . Colon polyps Neg Hx   . Rectal cancer Neg Hx   . Stomach cancer Neg Hx   . Esophageal cancer Neg Hx     Allergies  Allergen Reactions  . Morphine Nausea And Vomiting    Current Outpatient Medications on File Prior to Visit  Medication Sig Dispense Refill  . Aspirin-Caffeine (BC FAST PAIN RELIEF ARTHRITIS PO) Take by mouth.    .  doxazosin (CARDURA) 8 MG tablet TAKE 1 TABLET BY MOUTH AT BEDTIME 90 tablet 3  . enalapril (VASOTEC) 20 MG tablet TAKE 1 TABLET BY MOUTH EVERY DAY 90 tablet 2  . Ferrous Sulfate (IRON) 325 (65 Fe) MG TABS Take 1 tablet by mouth daily.    . furosemide (LASIX) 40 MG tablet TAKE 1 TABLET BY MOUTH TWICE A DAY 180 tablet 3  . ibuprofen (ADVIL,MOTRIN) 200 MG tablet Take 600 mg by mouth 2 (two) times daily as needed. Using for shoulder and back pain    . Multiple Vitamins-Minerals (CENTRUM SILVER 50+MEN PO) Take 1 tablet by mouth daily.    [name] omeprazole (PRILOSEC) 20 MG capsule TAKE 1 CAPSULE BY MOUTH EVERY DAY 90 capsule 2  . sildenafil (VIAGRA) 100 MG tablet take half to one tablet by mouth daily as needed for erectile dysfunction 90 tablet 1   No current facility-administered medications on file prior to visit.    BP 122/86   Temp 97.9 F (36.6 C)   Wt 263 lb (119.3 kg)   BMI 31.60 kg/m       Objective:   Physical Exam Vitals and nursing note reviewed.  Constitutional:      Appearance: Normal  appearance.  Genitourinary:    Testes:        Right: Mass and testicular hydrocele (?) present. Tenderness or swelling not present.     Epididymis:     Right: Normal.     Left: Normal.     Comments: Questionable hydrocele versus fatty cyst noted next to right testicle Neurological:     Mental Status: He is alert.       Assessment & Plan:  1. Mass of right testicle - No torsion felt. Mass non tender with palpation  - US Scrotum; Future   Shirline Frees, NP

## 2020-10-16 NOTE — Telephone Encounter (Signed)
Order placed. Nothing further needed. 

## 2020-11-18 ENCOUNTER — Ambulatory Visit
Admission: RE | Admit: 2020-11-18 | Discharge: 2020-11-18 | Disposition: A | Discharge status: Home / Self Care | Payer: 59 | Source: Ambulatory Visit | Attending: Adult Health | Admitting: Adult Health

## 2020-11-18 DIAGNOSIS — N5089 Other specified disorders of the male genital organs: Secondary | ICD-10-CM

## 2021-01-23 ENCOUNTER — Other Ambulatory Visit: Payer: Self-pay | Admitting: Adult Health

## 2021-05-25 ENCOUNTER — Other Ambulatory Visit: Payer: Self-pay

## 2021-05-26 ENCOUNTER — Ambulatory Visit (INDEPENDENT_AMBULATORY_CARE_PROVIDER_SITE_OTHER): Payer: 59 | Admitting: Adult Health

## 2021-05-26 ENCOUNTER — Encounter: Payer: Self-pay | Admitting: Adult Health

## 2021-05-26 VITALS — BP 128/90 | HR 77 | Temp 98.6°F | Ht 76.5 in | Wt 252.0 lb

## 2021-05-26 DIAGNOSIS — Z23 Encounter for immunization: Secondary | ICD-10-CM | POA: Diagnosis not present

## 2021-05-26 DIAGNOSIS — K219 Gastro-esophageal reflux disease without esophagitis: Secondary | ICD-10-CM | POA: Diagnosis not present

## 2021-05-26 DIAGNOSIS — E7439 Other disorders of intestinal carbohydrate absorption: Secondary | ICD-10-CM

## 2021-05-26 DIAGNOSIS — I10 Essential (primary) hypertension: Secondary | ICD-10-CM

## 2021-05-26 DIAGNOSIS — Z125 Encounter for screening for malignant neoplasm of prostate: Secondary | ICD-10-CM | POA: Diagnosis not present

## 2021-05-26 DIAGNOSIS — N521 Erectile dysfunction due to diseases classified elsewhere: Secondary | ICD-10-CM | POA: Diagnosis not present

## 2021-05-26 DIAGNOSIS — Z0000 Encounter for general adult medical examination without abnormal findings: Secondary | ICD-10-CM

## 2021-05-26 LAB — CBC WITH DIFFERENTIAL/PLATELET
Basophils Absolute: 0 10*3/uL (ref 0.0–0.1)
Basophils Relative: 0.5 % (ref 0.0–3.0)
Eosinophils Absolute: 0.2 10*3/uL (ref 0.0–0.7)
Eosinophils Relative: 3.7 % (ref 0.0–5.0)
HCT: 40.3 % (ref 39.0–52.0)
Hemoglobin: 12.7 g/dL — ABNORMAL LOW (ref 13.0–17.0)
Lymphocytes Relative: 24.4 % (ref 12.0–46.0)
Lymphs Abs: 1.4 10*3/uL (ref 0.7–4.0)
MCHC: 31.6 g/dL (ref 30.0–36.0)
MCV: 80.6 fl (ref 78.0–100.0)
Monocytes Absolute: 0.5 10*3/uL (ref 0.1–1.0)
Monocytes Relative: 9.1 % (ref 3.0–12.0)
Neutro Abs: 3.5 10*3/uL (ref 1.4–7.7)
Neutrophils Relative %: 62.3 % (ref 43.0–77.0)
Platelets: 357 10*3/uL (ref 150.0–400.0)
RBC: 4.99 Mil/uL (ref 4.22–5.81)
RDW: 15.8 % — ABNORMAL HIGH (ref 11.5–15.5)
WBC: 5.6 10*3/uL (ref 4.0–10.5)

## 2021-05-26 LAB — COMPREHENSIVE METABOLIC PANEL
ALT: 14 U/L (ref 0–53)
AST: 15 U/L (ref 0–37)
Albumin: 3.7 g/dL (ref 3.5–5.2)
Alkaline Phosphatase: 55 U/L (ref 39–117)
BUN: 16 mg/dL (ref 6–23)
CO2: 30 mEq/L (ref 19–32)
Calcium: 9.4 mg/dL (ref 8.4–10.5)
Chloride: 107 mEq/L (ref 96–112)
Creatinine, Ser: 1.02 mg/dL (ref 0.40–1.50)
GFR: 77.51 mL/min (ref >60.00)
Glucose, Bld: 96 mg/dL (ref 70–99)
Potassium: 4.4 mEq/L (ref 3.5–5.1)
Sodium: 143 mEq/L (ref 135–145)
Total Bilirubin: 0.4 mg/dL (ref 0.2–1.2)
Total Protein: 7.1 g/dL (ref 6.0–8.3)

## 2021-05-26 LAB — LIPID PANEL
Cholesterol: 139 mg/dL (ref 0–200)
HDL: 44.3 mg/dL (ref >39.00)
LDL Cholesterol: 81 mg/dL (ref 0–99)
NonHDL: 94.36
Total CHOL/HDL Ratio: 3
Triglycerides: 66 mg/dL (ref 0.0–149.0)
VLDL: 13.2 mg/dL (ref 0.0–40.0)

## 2021-05-26 LAB — TSH: TSH: 1.83 u[IU]/mL (ref 0.35–5.50)

## 2021-05-26 LAB — PSA: PSA: 2.41 ng/mL (ref 0.10–4.00)

## 2021-05-26 LAB — HEMOGLOBIN A1C: Hgb A1c MFr Bld: 6.7 % — ABNORMAL HIGH (ref 4.6–6.5)

## 2021-05-26 MED ORDER — OMEPRAZOLE 20 MG PO CPDR
20.0000 mg | DELAYED_RELEASE_CAPSULE | Freq: Every day | ORAL | 3 refills | Status: DC
Start: 2021-05-26 — End: 2022-05-27

## 2021-05-26 MED ORDER — FUROSEMIDE 40 MG PO TABS: 40.0000 mg | ORAL_TABLET | Freq: Every day | ORAL | 3 refills | Status: DC

## 2021-05-26 MED ORDER — SILDENAFIL CITRATE 100 MG PO TABS: ORAL_TABLET | ORAL | 1 refills | Status: DC

## 2021-05-26 MED ORDER — ENALAPRIL MALEATE 20 MG PO TABS: 20.0000 mg | ORAL_TABLET | Freq: Every day | ORAL | 3 refills | Status: DC

## 2021-05-26 NOTE — Addendum Note (Signed)
Addended by: Kandra Nicolas on: 05/26/2021 10:35 AM   Modules accepted: Orders

## 2021-05-26 NOTE — Addendum Note (Signed)
Addended by: Nancy Fetter on: 05/26/2021 10:34 AM   Modules accepted: Orders

## 2021-05-26 NOTE — Patient Instructions (Signed)
It was great seeing you today   We will follow up with you regarding your blood work   I will see you back in one year or sooner if needed 

## 2021-05-26 NOTE — Progress Notes (Addendum)
Subjective:    Patient ID: [name], male    DOB: 1956-06-18, 65 y.o.   MRN: [id number]  HPI Patient presents for yearly preventative medicine examination. He is a pleasant 65 year old male who  has a past medical history of Arthritis, GERD (gastroesophageal reflux disease), and Hypertension.  HTN -prescribed Vasotec 20 mg daily, Lasix 40 mg daily.  He denies chest pain, shortness of breath, dizziness, lightheadedness, or syncopal episodes. BP Readings from Last 3 Encounters:  05/26/21 128/90  10/16/20 122/86  04/08/20 124/82   GERD-controlled with Prilosec 40 mg daily  ED -takes Viagra as needed  Osteoarthritis - now being seen at [facility] for back and shoulder pain.   Glucose Intolerance - A1c was 5.9 last year. He has stopped eating junk foods and has cut back on energy drinks. He has been able to lose about 11 pounds   All immunizations and health maintenance protocols were reviewed with the patient and needed orders were placed. Will get flu shot today   Appropriate screening laboratory values were ordered for the patient including screening of hyperlipidemia, renal function and hepatic function. If indicated by BPH, a PSA was ordered.  Medication reconciliation,  past medical history, social history, problem list and allergies were reviewed in detail with the patient  Goals were established with regard to weight loss, exercise, and  diet in compliance with medications.   Wt Readings from Last 3 Encounters:  05/26/21 252 lb (114.3 kg)  10/16/20 263 lb (119.3 kg)  04/08/20 252 lb (114.3 kg)   He is up-to-date on routine colon cancer screening  Review of Systems  Constitutional: Negative.   HENT: Negative.    Eyes: Negative.   Respiratory: Negative.    Cardiovascular: Negative.   Gastrointestinal: Negative.   Endocrine: Negative.   Genitourinary: Negative.   Musculoskeletal:  Positive for arthralgias and back pain.  Skin: Negative.    Allergic/Immunologic: Negative.   Neurological: Negative.   Hematological: Negative.   Psychiatric/Behavioral: Negative.    All other systems reviewed and are negative.  Past Medical History:  Diagnosis Date   Arthritis    GERD (gastroesophageal reflux disease)    Hypertension     Social History   Socioeconomic History   Marital status: Married    Spouse name: Not on file   Number of children: Not on file   Years of education: Not on file   Highest education level: Not on file  Occupational History   Not on file  Tobacco Use   Smoking status: Former    Types: Cigarettes    Quit date: 01/12/1991    Years since quitting: 30.3   Smokeless tobacco: Never  Substance and Sexual Activity   Alcohol use: No   Drug use: No   Sexual activity: Not on file  Other Topics Concern   Not on file  Social History Narrative   Not on file   Social Determinants of Health   Financial Resource Strain: Not on file  Food Insecurity: Not on file  Transportation Needs: Not on file  Physical Activity: Not on file  Stress: Not on file  Social Connections: Not on file  Intimate Partner Violence: Not on file    Past Surgical History:  Procedure Laterality Date   MEDIAL PARTIAL KNEE REPLACEMENT Bilateral    SHOULDER SURGERY Left     Family History  Problem Relation Age of Onset   Colon polyps Neg Hx    Rectal cancer Neg Hx  Stomach cancer Neg Hx    Esophageal cancer Neg Hx     Allergies  Allergen Reactions   Morphine Nausea And Vomiting    Current Outpatient Medications on File Prior to Visit  Medication Sig Dispense Refill   enalapril (VASOTEC) 20 MG tablet TAKE 1 TABLET BY MOUTH EVERY DAY 30 tablet 8   furosemide (LASIX) 40 MG tablet TAKE 1 TABLET BY MOUTH TWICE A DAY 180 tablet 3   Multiple Vitamins-Minerals (CENTRUM SILVER 50+MEN PO) Take 1 tablet by mouth daily.     omeprazole (PRILOSEC) 20 MG capsule TAKE 1 CAPSULE BY MOUTH EVERY DAY 30 capsule 8   sildenafil (VIAGRA)  100 MG tablet take half to one tablet by mouth daily as needed for erectile dysfunction 90 tablet 1   No current facility-administered medications on file prior to visit.    BP 128/90   Pulse 77   Temp 98.6 F (37 C) (Oral)   Ht 6' 4.5" (1.943 m)   Wt 252 lb (114.3 kg)   SpO2 94%   BMI 30.27 kg/m        Objective:   Physical Exam Vitals and nursing note reviewed.  Constitutional:      General: He is not in acute distress.    Appearance: Normal appearance. He is well-developed and normal weight.  HENT:     Head: Normocephalic and atraumatic.     Right Ear: Tympanic membrane, ear canal and external ear normal. There is no impacted cerumen.     Left Ear: Tympanic membrane, ear canal and external ear normal. There is no impacted cerumen.     Nose: Nose normal. No congestion or rhinorrhea.     Mouth/Throat:     Mouth: Mucous membranes are moist.     Pharynx: Oropharynx is clear. No oropharyngeal exudate or posterior oropharyngeal erythema.  Eyes:     General:        Right eye: No discharge.        Left eye: No discharge.     Extraocular Movements: Extraocular movements intact.     Conjunctiva/sclera: Conjunctivae normal.     Pupils: Pupils are equal, round, and reactive to light.  Neck:     Vascular: No carotid bruit.     Trachea: No tracheal deviation.  Cardiovascular:     Rate and Rhythm: Normal rate and regular rhythm.     Pulses: Normal pulses.     Heart sounds: Normal heart sounds. No murmur heard.   No friction rub. No gallop.  Pulmonary:     Effort: Pulmonary effort is normal. No respiratory distress.     Breath sounds: Normal breath sounds. No stridor. No wheezing, rhonchi or rales.  Chest:     Chest wall: No tenderness.  Abdominal:     General: Bowel sounds are normal. There is no distension.     Palpations: Abdomen is soft. There is no mass.     Tenderness: There is no abdominal tenderness. There is no right CVA tenderness, left CVA tenderness, guarding or  rebound.     Hernia: No hernia is present.  Musculoskeletal:        General: No swelling, tenderness, deformity or signs of injury. Normal range of motion.     Right lower leg: No edema.     Left lower leg: No edema.  Lymphadenopathy:     Cervical: No cervical adenopathy.  Skin:    General: Skin is warm and dry.     Capillary Refill: Capillary refill takes  less than 2 seconds.     Coloration: Skin is not jaundiced or pale.     Findings: No bruising, erythema, lesion or rash.  Neurological:     General: No focal deficit present.     Mental Status: He is alert and oriented to person, place, and time.     Cranial Nerves: No cranial nerve deficit.     Sensory: No sensory deficit.     Motor: No weakness.     Coordination: Coordination normal.     Gait: Gait normal.     Deep Tendon Reflexes: Reflexes normal.  Psychiatric:        Mood and Affect: Mood normal.        Behavior: Behavior normal.        Thought Content: Thought content normal.        Judgment: Judgment normal.      Assessment & Plan:  1. Routine general medical examination at a health care facility - Continue to stay active and eat healthy  - Follow up in one year or sooner if needed - Fu shot given  - CBC with Differential/Platelet; Future - Comprehensive metabolic panel; Future - Hemoglobin A1c; Future - Lipid panel; Future - TSH; Future  2. Essential hypertension - Well controlled - No change in medication  - CBC with Differential/Platelet; Future - Comprehensive metabolic panel; Future - Hemoglobin A1c; Future - Lipid panel; Future - TSH; Future  3. Prostate cancer screening  - PSA; Future  4. Gastroesophageal reflux disease without esophagitis - Continue with PPI  5. Erectile dysfunction due to diseases classified elsewhere - Continue with Viagra  - CBC with Differential/Platelet; Future - Comprehensive metabolic panel; Future - Hemoglobin A1c; Future - Lipid panel; Future - TSH; Future  6.  Glucose intolerance - Consider metformin  - CBC with Differential/Platelet; Future - Comprehensive metabolic panel; Future - Hemoglobin A1c; Future  Shirline Frees, NP

## 2021-05-30 ENCOUNTER — Other Ambulatory Visit: Payer: Self-pay | Admitting: Adult Health

## 2021-05-30 DIAGNOSIS — I10 Essential (primary) hypertension: Secondary | ICD-10-CM

## 2021-06-03 ENCOUNTER — Telehealth: Payer: Self-pay | Admitting: Adult Health

## 2021-06-03 DIAGNOSIS — I10 Essential (primary) hypertension: Secondary | ICD-10-CM

## 2021-06-03 NOTE — Telephone Encounter (Signed)
PT called to request a refill of their doxazosin (CARDURA) 8 MG tablet  to be called into the [facility] pharmacy on file. Please advise.

## 2021-06-03 NOTE — Telephone Encounter (Signed)
Okay for refill?  

## 2021-06-04 MED ORDER — DOXAZOSIN MESYLATE 8 MG PO TABS: 8.0000 mg | ORAL_TABLET | Freq: Every day | ORAL | 0 refills | Status: DC

## 2021-06-04 NOTE — Telephone Encounter (Signed)
Rx refilled electronically °

## 2021-06-04 NOTE — Addendum Note (Signed)
Addended by: Waymon Amato R on: 06/04/2021 07:41 AM   Modules accepted: Orders

## 2021-08-04 ENCOUNTER — Other Ambulatory Visit: Payer: Self-pay | Admitting: Adult Health

## 2021-08-25 ENCOUNTER — Encounter: Payer: Self-pay | Admitting: Adult Health

## 2021-08-25 ENCOUNTER — Ambulatory Visit: Payer: 59 | Admitting: Adult Health

## 2021-08-25 VITALS — BP 122/82 | HR 73 | Temp 98.0°F | Wt 248.6 lb

## 2021-08-25 DIAGNOSIS — E7439 Other disorders of intestinal carbohydrate absorption: Secondary | ICD-10-CM | POA: Diagnosis not present

## 2021-08-25 LAB — POCT GLYCOSYLATED HEMOGLOBIN (HGB A1C): Hemoglobin A1C: 5.8 % — AB (ref 4.0–5.6)

## 2021-08-25 NOTE — Progress Notes (Signed)
Subjective:    Patient ID: [name], male    DOB: 1955/12/10, 65 y.o.   MRN: [id number]  HPI 65 year old male who  has a past medical history of Arthritis, GERD (gastroesophageal reflux disease), and Hypertension.  He presents to the office today for follow up regarding glucose intolerance.Most of A1c's in the past have been in the 5.9 range. During his CPE three months ago his A1c increased to 6.7  Rather than starting him on medication he opted for lifestyle modifications.  Today he reports that he stopped eating sweets and is eating healthier. He has been able to lose about 4 pounds. Has not had any difficulty staying away from sweets.   Lab Results  Component Value Date   HGBA1C 6.7 (H) 05/26/2021   Wt Readings from Last 3 Encounters:  08/25/21 248 lb 9.6 oz (112.8 kg)  05/26/21 252 lb (114.3 kg)  10/16/20 263 lb (119.3 kg)    Review of Systems See HPI   Past Medical History:  Diagnosis Date   Arthritis    GERD (gastroesophageal reflux disease)    Hypertension     Social History   Socioeconomic History   Marital status: Married    Spouse name: Not on file   Number of children: Not on file   Years of education: Not on file   Highest education level: 12th grade  Occupational History   Not on file  Tobacco Use   Smoking status: Former    Types: Cigarettes    Quit date: 01/12/1991    Years since quitting: 30.6   Smokeless tobacco: Never  Substance and Sexual Activity   Alcohol use: No   Drug use: No   Sexual activity: Not on file  Other Topics Concern   Not on file  Social History Narrative   Not on file   Social Determinants of Health   Financial Resource Strain: Low Risk    Difficulty of Paying Living Expenses: Not hard at all  Food Insecurity: No Food Insecurity   Worried About Programme researcher, broadcasting/film/video in the Last Year: Never true   Ran Out of Food in the Last Year: Never true  Transportation Needs: No Transportation Needs   Lack of Transportation  (Medical): No   Lack of Transportation (Non-Medical): No  Physical Activity: Insufficiently Active   Days of Exercise per Week: 3 days   Minutes of Exercise per Session: 20 min  Stress: No Stress Concern Present   Feeling of Stress : Not at all  Social Connections: Socially Integrated   Frequency of Communication with Friends and Family: More than three times a week   Frequency of Social Gatherings with Friends and Family: More than three times a week   Attends Religious Services: More than 4 times per year   Active Member of [employer] or Organizations: Yes   Attends Engineer, structural: More than 4 times per year   Marital Status: Married  Catering manager Violence: Not on file    Past Surgical History:  Procedure Laterality Date   MEDIAL PARTIAL KNEE REPLACEMENT Bilateral    SHOULDER SURGERY Left     Family History  Problem Relation Age of Onset   Colon polyps Neg Hx    Rectal cancer Neg Hx    Stomach cancer Neg Hx    Esophageal cancer Neg Hx     Allergies  Allergen Reactions   Morphine Nausea And Vomiting    Current Outpatient Medications on File Prior  to Visit  Medication Sig Dispense Refill   doxazosin (CARDURA) 8 MG tablet TAKE 1 TABLET BY MOUTH EVERYDAY AT BEDTIME 30 tablet 11   enalapril (VASOTEC) 20 MG tablet Take 1 tablet (20 mg total) by mouth daily. 90 tablet 3   Multiple Vitamins-Minerals (CENTRUM SILVER 50+MEN PO) Take 1 tablet by mouth daily.     omeprazole (PRILOSEC) 20 MG capsule Take 1 capsule (20 mg total) by mouth daily. 90 capsule 3   sildenafil (VIAGRA) 100 MG tablet take half to one tablet by mouth daily as needed for erectile dysfunction 90 tablet 1   furosemide (LASIX) 40 MG tablet Take 1 tablet (40 mg total) by mouth daily. 90 tablet 3   No current facility-administered medications on file prior to visit.    BP 122/82 (BP Location: Left Arm, Patient Position: Sitting, Cuff Size: Large)    Pulse 73    Temp 98 F (36.7 C) (Oral)    Wt 248  lb 9.6 oz (112.8 kg)    SpO2 97%    BMI 29.87 kg/m       Objective:   Physical Exam Vitals and nursing note reviewed.  Constitutional:      Appearance: Normal appearance.  Cardiovascular:     Rate and Rhythm: Normal rate and regular rhythm.     Pulses: Normal pulses.     Heart sounds: Normal heart sounds.  Pulmonary:     Effort: Pulmonary effort is normal.     Breath sounds: Normal breath sounds.  Skin:    General: Skin is warm and dry.  Neurological:     General: No focal deficit present.     Mental Status: He is alert and oriented to person, place, and time.  Psychiatric:        Mood and Affect: Mood normal.        Behavior: Behavior normal.        Thought Content: Thought content normal.      Assessment & Plan:  1. Glucose intolerance  - POCT glycosylated hemoglobin (Hb A1C)- 5.8 today  - Continue to work on lifestyle modifications  - Follow up in 6 months or sooner   Shirline Frees, NP

## 2021-10-02 ENCOUNTER — Other Ambulatory Visit: Payer: Self-pay | Admitting: Adult Health

## 2022-02-24 ENCOUNTER — Encounter: Payer: Self-pay | Admitting: Adult Health

## 2022-02-24 ENCOUNTER — Ambulatory Visit: Payer: 59 | Admitting: Adult Health

## 2022-02-24 VITALS — BP 120/80 | HR 80 | Temp 98.4°F | Ht 76.5 in | Wt 243.0 lb

## 2022-02-24 DIAGNOSIS — I10 Essential (primary) hypertension: Secondary | ICD-10-CM | POA: Diagnosis not present

## 2022-02-24 DIAGNOSIS — E7439 Other disorders of intestinal carbohydrate absorption: Secondary | ICD-10-CM

## 2022-02-24 LAB — POCT GLYCOSYLATED HEMOGLOBIN (HGB A1C): HbA1c, POC (prediabetic range): 5.7 % (ref 5.7–6.4)

## 2022-02-24 NOTE — Progress Notes (Signed)
Subjective:    Patient ID: [name], male    DOB: 1955-10-11, 66 y.o.   MRN: [id number]  HPI 66 year old male who  has a past medical history of Arthritis, GERD (gastroesophageal reflux disease), and Hypertension.  He presents to the office today for 7-month follow-up regarding glucose intolerance and HTN .  During his CPE in September 2000 26.7.  He opted to work on lifestyle modifications and stopped eating sweets and eating healthier.  He was able to lose about 4 pounds.  Subsequently his A1c dropped back to 5.13 August 2021.  He continues to work on eating healthy and exercising. Since February 2022 he has lost roughly 20 pounds   Lab Results  Component Value Date   HGBA1C 5.7 02/24/2022   Wt Readings from Last 10 Encounters:  02/24/22 243 lb (110.2 kg)  08/25/21 248 lb 9.6 oz (112.8 kg)  05/26/21 252 lb (114.3 kg)  10/16/20 263 lb (119.3 kg)  04/08/20 252 lb (114.3 kg)  04/04/19 251 lb (113.9 kg)  09/05/18 257 lb (116.6 kg)  01/04/18 256 lb (116.1 kg)  04/19/17 256 lb 8 oz (116.3 kg)  01/24/17 257 lb (116.6 kg)    HTN -managed with Vasotec 20 mg daily Lasix 40 mg daily.  He denies chest pain, dizziness lightheadedness episodes. BP Readings from Last 3 Encounters:  02/24/22 120/80  08/25/21 122/82  05/26/21 128/90    Review of Systems See HPI  Past Medical History:  Diagnosis Date   Arthritis    GERD (gastroesophageal reflux disease)    Hypertension     Social History   Socioeconomic History   Marital status: Married    Spouse name: Not on file   Number of children: Not on file   Years of education: Not on file   Highest education level: 12th grade  Occupational History   Not on file  Tobacco Use   Smoking status: Former    Types: Cigarettes    Quit date: 01/12/1991    Years since quitting: 31.1   Smokeless tobacco: Never  Substance and Sexual Activity   Alcohol use: No   Drug use: No   Sexual activity: Not on file  Other Topics Concern    Not on file  Social History Narrative   Not on file   Social Determinants of Health   Financial Resource Strain: Low Risk  (08/21/2021)   Overall Financial Resource Strain (CARDIA)    Difficulty of Paying Living Expenses: Not hard at all  Food Insecurity: No Food Insecurity (08/21/2021)   Hunger Vital Sign    Worried About Running Out of Food in the Last Year: Never true    Ran Out of Food in the Last Year: Never true  Transportation Needs: No Transportation Needs (08/21/2021)   PRAPARE - Administrator, Civil Service (Medical): No    Lack of Transportation (Non-Medical): No  Physical Activity: Insufficiently Active (08/21/2021)   Exercise Vital Sign    Days of Exercise per Week: 3 days    Minutes of Exercise per Session: 20 min  Stress: No Stress Concern Present (08/21/2021)   [employer] of Occupational Health - Occupational Stress Questionnaire    Feeling of Stress : Not at all  Social Connections: Socially Integrated (08/21/2021)   Social Connection and Isolation Panel [NHANES]    Frequency of Communication with Friends and Family: More than three times a week    Frequency of Social Gatherings with Friends and Family: More  than three times a week    Attends Religious Services: More than 4 times per year    Active Member of Clubs or Organizations: Yes    Attends Engineer, structural: More than 4 times per year    Marital Status: Married  Catering manager Violence: Not on file    Past Surgical History:  Procedure Laterality Date   MEDIAL PARTIAL KNEE REPLACEMENT Bilateral    SHOULDER SURGERY Left     Family History  Problem Relation Age of Onset   Colon polyps Neg Hx    Rectal cancer Neg Hx    Stomach cancer Neg Hx    Esophageal cancer Neg Hx     Allergies  Allergen Reactions   Morphine Nausea And Vomiting    Current Outpatient Medications on File Prior to Visit  Medication Sig Dispense Refill   Aspirin-Salicylamide-Caffeine  (ARTHRITIS STRENGTH BC POWDER PO) Arthritis Strength BC Powder     doxazosin (CARDURA) 8 MG tablet TAKE 1 TABLET BY MOUTH EVERYDAY AT BEDTIME 30 tablet 11   enalapril (VASOTEC) 20 MG tablet Take 1 tablet (20 mg total) by mouth daily. 90 tablet 3   furosemide (LASIX) 40 MG tablet TAKE 1 TABLET BY MOUTH TWICE A DAY 180 tablet 3   Multiple Vitamins-Minerals (CENTRUM SILVER 50+MEN PO) Take 1 tablet by mouth daily.     omeprazole (PRILOSEC) 20 MG capsule Take 1 capsule (20 mg total) by mouth daily. 90 capsule 3   sildenafil (VIAGRA) 100 MG tablet take half to one tablet by mouth daily as needed for erectile dysfunction 90 tablet 1   amoxicillin (AMOXIL) 500 MG tablet amoxicillin 500 mg tablet  TAKE 4 TABLETS ONE HOUR BEFORE DENTAL APPOINTMENT (Patient not taking: Reported on 02/24/2022)     No current facility-administered medications on file prior to visit.    BP 120/80   Pulse 80   Temp 98.4 F (36.9 C) (Oral)   Ht 6' 4.5" (1.943 m)   Wt 243 lb (110.2 kg)   SpO2 96%   BMI 29.19 kg/m          Objective:   Physical Exam Vitals and nursing note reviewed.  Constitutional:      Appearance: Normal appearance.  Cardiovascular:     Rate and Rhythm: Normal rate and regular rhythm.     Pulses: Normal pulses.     Heart sounds: Normal heart sounds.  Pulmonary:     Effort: Pulmonary effort is normal.     Breath sounds: Normal breath sounds.  Musculoskeletal:        General: Normal range of motion.  Skin:    General: Skin is warm and dry.  Neurological:     General: No focal deficit present.     Mental Status: He is alert and oriented to person, place, and time.  Psychiatric:        Mood and Affect: Mood normal.        Behavior: Behavior normal.        Thought Content: Thought content normal.       Assessment & Plan:  1. Glucose intolerance  - POC HgB A1c- 5.7  - Back to normal levels  - Continue to eat healthy and stay active   2. Essential hypertension - well controlled.   - No change in medications   Shirline Frees, NP

## 2022-02-24 NOTE — Patient Instructions (Signed)
It was great seeing you today   Your A1c ( blood sugar) has dropped back down to a normal level   I will see you after September 20th for your physical exam.

## 2022-05-05 ENCOUNTER — Other Ambulatory Visit: Payer: Self-pay | Admitting: Orthopedic Surgery

## 2022-05-05 DIAGNOSIS — M25512 Pain in left shoulder: Secondary | ICD-10-CM

## 2022-05-27 ENCOUNTER — Other Ambulatory Visit (INDEPENDENT_AMBULATORY_CARE_PROVIDER_SITE_OTHER): Payer: 59

## 2022-05-27 ENCOUNTER — Ambulatory Visit (INDEPENDENT_AMBULATORY_CARE_PROVIDER_SITE_OTHER): Payer: 59 | Admitting: Adult Health

## 2022-05-27 ENCOUNTER — Encounter: Payer: Self-pay | Admitting: Adult Health

## 2022-05-27 VITALS — BP 130/82 | HR 81 | Temp 98.0°F | Ht 75.0 in | Wt 240.0 lb

## 2022-05-27 DIAGNOSIS — Z125 Encounter for screening for malignant neoplasm of prostate: Secondary | ICD-10-CM | POA: Diagnosis not present

## 2022-05-27 DIAGNOSIS — K219 Gastro-esophageal reflux disease without esophagitis: Secondary | ICD-10-CM | POA: Diagnosis not present

## 2022-05-27 DIAGNOSIS — Z0000 Encounter for general adult medical examination without abnormal findings: Secondary | ICD-10-CM | POA: Diagnosis not present

## 2022-05-27 DIAGNOSIS — E7439 Other disorders of intestinal carbohydrate absorption: Secondary | ICD-10-CM | POA: Diagnosis not present

## 2022-05-27 DIAGNOSIS — I10 Essential (primary) hypertension: Secondary | ICD-10-CM

## 2022-05-27 DIAGNOSIS — N521 Erectile dysfunction due to diseases classified elsewhere: Secondary | ICD-10-CM

## 2022-05-27 DIAGNOSIS — M159 Polyosteoarthritis, unspecified: Secondary | ICD-10-CM

## 2022-05-27 LAB — LIPID PANEL
Cholesterol: 125 mg/dL (ref 0–200)
HDL: 44.9 mg/dL (ref >39.00)
LDL Cholesterol: 70 mg/dL (ref 0–99)
NonHDL: 80.43
Total CHOL/HDL Ratio: 3
Triglycerides: 52 mg/dL (ref 0.0–149.0)
VLDL: 10.4 mg/dL (ref 0.0–40.0)

## 2022-05-27 LAB — CBC WITH DIFFERENTIAL/PLATELET
Basophils Absolute: 0 10*3/uL (ref 0.0–0.1)
Basophils Relative: 0.4 % (ref 0.0–3.0)
Eosinophils Absolute: 0.2 10*3/uL (ref 0.0–0.7)
Eosinophils Relative: 3.3 % (ref 0.0–5.0)
HCT: 37.5 % — ABNORMAL LOW (ref 39.0–52.0)
Hemoglobin: 12.1 g/dL — ABNORMAL LOW (ref 13.0–17.0)
Lymphocytes Relative: 18.2 % (ref 12.0–46.0)
Lymphs Abs: 1.1 10*3/uL (ref 0.7–4.0)
MCHC: 32.3 g/dL (ref 30.0–36.0)
MCV: 79.4 fl (ref 78.0–100.0)
Monocytes Absolute: 0.5 10*3/uL (ref 0.1–1.0)
Monocytes Relative: 8.7 % (ref 3.0–12.0)
Neutro Abs: 4.1 10*3/uL (ref 1.4–7.7)
Neutrophils Relative %: 69.4 % (ref 43.0–77.0)
Platelets: 319 10*3/uL (ref 150.0–400.0)
RBC: 4.72 Mil/uL (ref 4.22–5.81)
RDW: 15.4 % (ref 11.5–15.5)
WBC: 5.9 10*3/uL (ref 4.0–10.5)

## 2022-05-27 LAB — TSH: TSH: 1.71 u[IU]/mL (ref 0.35–5.50)

## 2022-05-27 LAB — COMPREHENSIVE METABOLIC PANEL
ALT: 15 U/L (ref 0–53)
AST: 15 U/L (ref 0–37)
Albumin: 3.4 g/dL — ABNORMAL LOW (ref 3.5–5.2)
Alkaline Phosphatase: 59 U/L (ref 39–117)
BUN: 18 mg/dL (ref 6–23)
CO2: 31 mEq/L (ref 19–32)
Calcium: 9.1 mg/dL (ref 8.4–10.5)
Chloride: 106 mEq/L (ref 96–112)
Creatinine, Ser: 1.06 mg/dL (ref 0.40–1.50)
GFR: 73.49 mL/min (ref >60.00)
Glucose, Bld: 87 mg/dL (ref 70–99)
Potassium: 5 mEq/L (ref 3.5–5.1)
Sodium: 142 mEq/L (ref 135–145)
Total Bilirubin: 0.4 mg/dL (ref 0.2–1.2)
Total Protein: 7.1 g/dL (ref 6.0–8.3)

## 2022-05-27 LAB — PSA: PSA: 3.14 ng/mL (ref 0.10–4.00)

## 2022-05-27 MED ORDER — OMEPRAZOLE 20 MG PO CPDR: 20.0000 mg | DELAYED_RELEASE_CAPSULE | Freq: Every day | ORAL | 3 refills | Status: DC

## 2022-05-27 MED ORDER — SILDENAFIL CITRATE 100 MG PO TABS: ORAL_TABLET | ORAL | 1 refills | Status: DC

## 2022-05-27 MED ORDER — FUROSEMIDE 40 MG PO TABS: 40.0000 mg | ORAL_TABLET | Freq: Two times a day (BID) | ORAL | 3 refills | Status: DC

## 2022-05-27 MED ORDER — ENALAPRIL MALEATE 20 MG PO TABS: 20.0000 mg | ORAL_TABLET | Freq: Every day | ORAL | 3 refills | Status: DC

## 2022-05-27 MED ORDER — DOXAZOSIN MESYLATE 8 MG PO TABS: ORAL_TABLET | ORAL | 3 refills | Status: DC

## 2022-05-27 NOTE — Progress Notes (Signed)
Subjective:    Patient ID: [name], male    DOB: 1956/01/25, 66 y.o.   MRN: [id number]  HPI Patient presents for yearly preventative medicine examination. She is a pleasant 66 year old male who  has a past medical history of Arthritis, GERD (gastroesophageal reflux disease), and Hypertension.  Glucose intolerance-not currently on medication, who has been eating healthier and is staying active with work.  Lab Results  Component Value Date   HGBA1C 5.7 02/24/2022   HTN -managed with Vasotec 20 mg daily, Cardura 8 mg, and Lasix 40 mg daily.  He denies chest pain, dizziness, lightheadedness, or syncopal episodes Lab Results  Component Value Date   CHOL 139 05/26/2021   HDL 44.30 05/26/2021   LDLCALC 81 05/26/2021   TRIG 66.0 05/26/2021   CHOLHDL 3 05/26/2021   GERD-controlled with Prilosec 40 mg daily  Erectile dysfunction-takes Viagra as needed  Osteoarthritis-being seen at [facility] for back and shoulder pain. He is having a reverse shoulder replacement for his left shoulder on December 8th.   BPH - take Cardura 8 mg. Denies symptoms.   All immunizations and health maintenance protocols were reviewed with the patient and needed orders were placed.  Appropriate screening laboratory values were ordered for the patient including screening of hyperlipidemia, renal function and hepatic function. If indicated by BPH, a PSA was ordered.  Medication reconciliation,  past medical history, social history, problem list and allergies were reviewed in detail with the patient  Goals were established with regard to weight loss, exercise, and  diet in compliance with medications  Wt Readings from Last 10 Encounters:  05/27/22 240 lb (108.9 kg)  02/24/22 243 lb (110.2 kg)  08/25/21 248 lb 9.6 oz (112.8 kg)  05/26/21 252 lb (114.3 kg)  10/16/20 263 lb (119.3 kg)  04/08/20 252 lb (114.3 kg)  04/04/19 251 lb (113.9 kg)  09/05/18 257 lb (116.6 kg)  01/04/18 256 lb (116.1 kg)   04/19/17 256 lb 8 oz (116.3 kg)   He is up to date on routine colon cancer screening.   Review of Systems  Constitutional: Negative.   HENT: Negative.    Eyes: Negative.   Respiratory: Negative.    Cardiovascular: Negative.   Gastrointestinal: Negative.   Endocrine: Negative.   Genitourinary: Negative.   Musculoskeletal:  Positive for arthralgias and back pain.  Skin: Negative.   Allergic/Immunologic: Negative.   Neurological: Negative.   Hematological: Negative.   Psychiatric/Behavioral: Negative.    All other systems reviewed and are negative.  Past Medical History:  Diagnosis Date   Arthritis    GERD (gastroesophageal reflux disease)    Hypertension     Social History   Socioeconomic History   Marital status: Married    Spouse name: Not on file   Number of children: Not on file   Years of education: Not on file   Highest education level: 12th grade  Occupational History   Not on file  Tobacco Use   Smoking status: Former    Types: Cigarettes    Quit date: 01/12/1991    Years since quitting: 31.3   Smokeless tobacco: Never  Substance and Sexual Activity   Alcohol use: No   Drug use: No   Sexual activity: Not on file  Other Topics Concern   Not on file  Social History Narrative   Not on file   Social Determinants of Health   Financial Resource Strain: Low Risk  (08/21/2021)   Overall Financial Resource Strain (CARDIA)  Difficulty of Paying Living Expenses: Not hard at all  Food Insecurity: No Food Insecurity (08/21/2021)   Hunger Vital Sign    Worried About Running Out of Food in the Last Year: Never true    Ran Out of Food in the Last Year: Never true  Transportation Needs: No Transportation Needs (08/21/2021)   PRAPARE - Administrator, Civil ServiceTransportation    Lack of Transportation (Medical): No    Lack of Transportation (Non-Medical): No  Physical Activity: Insufficiently Active (08/21/2021)   Exercise Vital Sign    Days of Exercise per Week: 3 days    Minutes of  Exercise per Session: 20 min  Stress: No Stress Concern Present (08/21/2021)   [employer] - Occupational Stress Questionnaire    Feeling of Stress : Not at all  Social Connections: Socially Integrated (08/21/2021)   Social Connection and Isolation Panel [NHANES]    Frequency of Communication with Friends and Family: More than three times a week    Frequency of Social Gatherings with Friends and Family: More than three times a week    Attends Religious Services: More than 4 times per year    Active Member of [employer]ubs or Organizations: Yes    Attends Engineer, structuralClub or Organization Meetings: More than 4 times per year    Marital Status: Married  Catering managerntimate Partner Violence: Not on file    Past Surgical History:  Procedure Laterality Date   MEDIAL PARTIAL KNEE REPLACEMENT Bilateral    SHOULDER SURGERY Left     Family History  Problem Relation Age of Onset   Colon polyps Neg Hx    Rectal cancer Neg Hx    Stomach cancer Neg Hx    Esophageal cancer Neg Hx     Allergies  Allergen Reactions   Morphine Nausea And Vomiting    Current Outpatient Medications on File Prior to Visit  Medication Sig Dispense Refill   amoxicillin (AMOXIL) 500 MG tablet      Aspirin-Salicylamide-Caffeine (ARTHRITIS STRENGTH BC POWDER PO) Arthritis Strength BC Powder     doxazosin (CARDURA) 8 MG tablet TAKE 1 TABLET BY MOUTH EVERYDAY AT BEDTIME 30 tablet 11   enalapril (VASOTEC) 20 MG tablet Take 1 tablet (20 mg total) by mouth daily. 90 tablet 3   furosemide (LASIX) 40 MG tablet TAKE 1 TABLET BY MOUTH TWICE A DAY 180 tablet 3   Multiple Vitamins-Minerals (CENTRUM SILVER 50+MEN PO) Take 1 tablet by mouth daily.     omeprazole (PRILOSEC) 20 MG capsule Take 1 capsule (20 mg total) by mouth daily. 90 capsule 3   sildenafil (VIAGRA) 100 MG tablet take half to one tablet by mouth daily as needed for erectile dysfunction 90 tablet 1   No current facility-administered medications on file prior to  visit.    BP 130/82   Pulse 81   Temp 98 F (36.7 C) (Oral)   Ht 6\' 3"  (1.905 m)   Wt 240 lb (108.9 kg)   SpO2 98%   BMI 30.00 kg/m       Objective:   Physical Exam Vitals and nursing note reviewed.  Constitutional:      General: He is not in acute distress.    Appearance: Normal appearance. He is well-developed and normal weight.  HENT:     Head: Normocephalic and atraumatic.     Right Ear: Tympanic membrane, ear canal and external ear normal. There is no impacted cerumen.     Left Ear: Tympanic membrane, ear canal and external ear normal. There  is no impacted cerumen.     Nose: Nose normal. No congestion or rhinorrhea.     Mouth/Throat:     Mouth: Mucous membranes are moist.     Pharynx: Oropharynx is clear. No oropharyngeal exudate or posterior oropharyngeal erythema.  Eyes:     General:        Right eye: No discharge.        Left eye: No discharge.     Extraocular Movements: Extraocular movements intact.     Conjunctiva/sclera: Conjunctivae normal.     Pupils: Pupils are equal, round, and reactive to light.  Neck:     Vascular: No carotid bruit.     Trachea: No tracheal deviation.  Cardiovascular:     Rate and Rhythm: Normal rate and regular rhythm.     Pulses: Normal pulses.     Heart sounds: Normal heart sounds. No murmur heard.    No friction rub. No gallop.  Pulmonary:     Effort: Pulmonary effort is normal. No respiratory distress.     Breath sounds: Normal breath sounds. No stridor. No wheezing, rhonchi or rales.  Chest:     Chest wall: No tenderness.  Abdominal:     General: Bowel sounds are normal. There is no distension.     Palpations: Abdomen is soft. There is no mass.     Tenderness: There is no abdominal tenderness. There is no right CVA tenderness, left CVA tenderness, guarding or rebound.     Hernia: No hernia is present.  Musculoskeletal:        General: No swelling, tenderness, deformity or signs of injury. Normal range of motion.     Right  lower leg: No edema.     Left lower leg: No edema.  Lymphadenopathy:     Cervical: No cervical adenopathy.  Skin:    General: Skin is warm and dry.     Capillary Refill: Capillary refill takes less than 2 seconds.     Coloration: Skin is not jaundiced or pale.     Findings: No bruising, erythema, lesion or rash.  Neurological:     General: No focal deficit present.     Mental Status: He is alert and oriented to person, place, and time.     Cranial Nerves: No cranial nerve deficit.     Sensory: No sensory deficit.     Motor: No weakness.     Coordination: Coordination normal.     Gait: Gait normal.     Deep Tendon Reflexes: Reflexes normal.  Psychiatric:        Mood and Affect: Mood normal.        Behavior: Behavior normal.        Thought Content: Thought content normal.        Judgment: Judgment normal.       Assessment & Plan:  1. Routine general medical examination at a health care facility - He is doing well with weight loss through lifestyle modifications  - Follow up in one year or sooner if needed  - CBC with Differential/Platelet; Future - Comprehensive metabolic panel; Future - Hemoglobin A1c; Future - Lipid panel; Future - TSH; Future  2. Essential hypertension - Well controlled.  - No change in medications  - CBC with Differential/Platelet; Future - Comprehensive metabolic panel; Future - Hemoglobin A1c; Future - Lipid panel; Future - TSH; Future - doxazosin (CARDURA) 8 MG tablet; TAKE 1 TABLET BY MOUTH EVERYDAY AT BEDTIME  Dispense: 90 tablet; Refill: 3 - enalapril (VASOTEC) 20  MG tablet; Take 1 tablet (20 mg total) by mouth daily.  Dispense: 90 tablet; Refill: 3 - furosemide (LASIX) 40 MG tablet; Take 1 tablet (40 mg total) by mouth 2 (two) times daily.  Dispense: 180 tablet; Refill: 3  3. Glucose intolerance - Consider agent  - CBC with Differential/Platelet; Future - Comprehensive metabolic panel; Future - Hemoglobin A1c; Future - Lipid panel;  Future - TSH; Future  4. Prostate cancer screening  - PSA; Future - doxazosin (CARDURA) 8 MG tablet; TAKE 1 TABLET BY MOUTH EVERYDAY AT BEDTIME  Dispense: 90 tablet; Refill: 3  5. Gastroesophageal reflux disease without esophagitis - Continue PPI  - CBC with Differential/Platelet; Future - Comprehensive metabolic panel; Future - Hemoglobin A1c; Future - Lipid panel; Future - TSH; Future - omeprazole (PRILOSEC) 20 MG capsule; Take 1 capsule (20 mg total) by mouth daily.  Dispense: 90 capsule; Refill: 3  6. Erectile dysfunction due to diseases classified elsewhere  - CBC with Differential/Platelet; Future - Comprehensive metabolic panel; Future - Hemoglobin A1c; Future - Lipid panel; Future - TSH; Future - sildenafil (VIAGRA) 100 MG tablet; take half to one tablet by mouth daily as needed for erectile dysfunction  Dispense: 90 tablet; Refill: 1  7. Primary osteoarthritis involving multiple joints - Per [facility]  - CBC with Differential/Platelet; Future - Comprehensive metabolic panel; Future - Hemoglobin A1c; Future - Lipid panel; Future - TSH; Future   Dorothyann Peng, NP

## 2022-05-27 NOTE — Patient Instructions (Signed)
It was great seeing you today   We will follow up with you regarding your lab work   Please let me know if you need anything   

## 2022-05-28 ENCOUNTER — Other Ambulatory Visit: Payer: Self-pay | Admitting: Orthopedic Surgery

## 2022-05-28 DIAGNOSIS — M25512 Pain in left shoulder: Secondary | ICD-10-CM

## 2022-05-31 LAB — HEMOGLOBIN A1C: Hgb A1c MFr Bld: 6.5 % (ref 4.6–6.5)

## 2022-06-01 ENCOUNTER — Ambulatory Visit
Admission: RE | Admit: 2022-06-01 | Discharge: 2022-06-01 | Disposition: A | Discharge status: Home / Self Care | Payer: 59 | Source: Ambulatory Visit | Attending: Orthopedic Surgery | Admitting: Orthopedic Surgery

## 2022-06-01 DIAGNOSIS — M25512 Pain in left shoulder: Secondary | ICD-10-CM

## 2022-07-26 NOTE — H&P (Signed)
Patient's anticipated LOS is less than 2 midnights, meeting these requirements: - Younger than 41 - Lives within 1 hour of care - Has a competent adult at home to recover with post-op recover - NO history of  - Chronic pain requiring opiods  - Diabetes  - Coronary Artery Disease  - Heart failure  - Heart attack  - Stroke  - DVT/VTE  - Cardiac arrhythmia  - Respiratory Failure/COPD  - Renal failure  - Anemia  - Advanced Liver disease     [name] is an 66 y.o. male.    Chief Complaint: left shoulder pain  HPI: Pt is a 65 y.o. male complaining of left shoulder pain for multiple years. Pain had continually increased since the beginning. X-rays in the clinic show end-stage arthritic changes of the left shoulder. Pt has tried various conservative treatments which have failed to alleviate their symptoms, including injections and therapy. Various options are discussed with the patient. Risks, benefits and expectations were discussed with the patient. Patient understand the risks, benefits and expectations and wishes to proceed with surgery.   PCP:  Shirline Frees, NP  D/C Plans: Home  PMH: Past Medical History:  Diagnosis Date   Arthritis    GERD (gastroesophageal reflux disease)    Hypertension     PSH: Past Surgical History:  Procedure Laterality Date   MEDIAL PARTIAL KNEE REPLACEMENT Bilateral    SHOULDER SURGERY Left     Social History:  reports that he quit smoking about 31 years ago. His smoking use included cigarettes. He has never used smokeless tobacco. He reports that he does not drink alcohol and does not use drugs. BMI: Estimated body mass index is 30 kg/m as calculated from the following:   Height as of 05/27/22: 6\' 3"  (1.905 m).   Weight as of 05/27/22: 108.9 kg.  Lab Results  Component Value Date   ALBUMIN 3.4 (L) 05/27/2022   Diabetes: Patient does not have a diagnosis of diabetes. Lab Results  Component Value Date   HGBA1C 6.5 05/27/2022      Smoking Status:      Allergies:  Allergies  Allergen Reactions   Morphine Nausea And Vomiting    Medications: No current facility-administered medications for this encounter.   Current Outpatient Medications  Medication Sig Dispense Refill   amoxicillin (AMOXIL) 500 MG tablet      Aspirin-Salicylamide-Caffeine (ARTHRITIS STRENGTH BC POWDER PO) Arthritis Strength BC Powder     doxazosin (CARDURA) 8 MG tablet TAKE 1 TABLET BY MOUTH EVERYDAY AT BEDTIME 90 tablet 3   enalapril (VASOTEC) 20 MG tablet Take 1 tablet (20 mg total) by mouth daily. 90 tablet 3   furosemide (LASIX) 40 MG tablet Take 1 tablet (40 mg total) by mouth 2 (two) times daily. 180 tablet 3   Multiple Vitamins-Minerals (CENTRUM SILVER 50+MEN PO) Take 1 tablet by mouth daily.     omeprazole (PRILOSEC) 20 MG capsule Take 1 capsule (20 mg total) by mouth daily. 90 capsule 3   sildenafil (VIAGRA) 100 MG tablet take half to one tablet by mouth daily as needed for erectile dysfunction 90 tablet 1    No results found for this or any previous visit (from the past 48 hour(s)). No results found.  ROS: Pain with rom of the left upper extremity  Physical Exam: Alert and oriented 66 y.o. male in no acute distress Cranial nerves 2-12 intact Cervical spine: full rom with no tenderness, nv intact distally Chest: active breath sounds bilaterally, no  wheeze rhonchi or rales Heart: regular rate and rhythm, no murmur Abd: non tender non distended with active bowel sounds Hip is stable with rom  Left shoulder painful rom with weakness Nv intact distally No rashes or edema   Assessment/Plan Assessment: left shoulder cuff arthropathy  Plan:  Patient will undergo a left reverse total shoulder by Dr. Veverly Fells at [place] Risks benefits and expectations were discussed with the patient. Patient understand risks, benefits and expectations and wishes to proceed. Preoperative templating of the joint replacement has been completed,  documented, and submitted to the Operating Room personnel in order to optimize intra-operative equipment management.   Merla Riches PA-C, MPAS [facility] is now [facility] [address], [place], [place], [place] [address] Phone: [phone] [email or web address] Facebook  [employer]

## 2022-07-28 NOTE — Patient Instructions (Signed)
DUE TO COVID-19 ONLY TWO VISITORS  (aged 66 and older)  ARE ALLOWED TO COME WITH YOU AND STAY IN THE WAITING ROOM ONLY DURING PRE OP AND PROCEDURE.   **NO VISITORS ARE ALLOWED IN THE SHORT STAY AREA OR RECOVERY ROOM!!**  IF YOU WILL BE ADMITTED INTO THE HOSPITAL YOU ARE ALLOWED ONLY FOUR SUPPORT PEOPLE DURING VISITATION HOURS ONLY (7 AM -8PM)   The support person(s) must pass our screening, gel in and out, and wear a mask at all times, including in the patient's room. Patients must also wear a mask when staff or their support person are in the room. Visitors GUEST BADGE MUST BE WORN VISIBLY  One adult visitor may remain with you overnight and MUST be in the room by 8 P.M.     Your procedure is scheduled on: 08/13/22   [facility]    Report to admitting at  5:15 AM   Call this number if you have problems the morning of surgery [phone]   Do not eat food :After Midnight.   After Midnight you may have the following liquids until _4:15_____ AM/  DAY OF SURGERY  Water Black Coffee (sugar ok, NO MILK/CREAM OR CREAMERS)  Tea (sugar ok, NO MILK/CREAM OR CREAMERS) regular and decaf                             Plain Jell-O (NO RED)                                           Fruit ices (not with fruit pulp, NO RED)                                     Popsicles (NO RED)                                                                  Juice: apple, WHITE grape, WHITE cranberry Sports drinks like Gatorade (NO RED)                  The day of surgery:  Drink ONE (1) Pre-Surgery Clear Ensure  at 4:00 AM the morning of surgery. Drink in one sitting. Do not sip.  This drink was given to you during your hospital  pre-op appointment visit. Nothing else to drink after completing the  Pre-Surgery Clear Ensure 4:15 AM          If you have questions, please contact your surgeon's office.       Oral Hygiene is also important to reduce your risk of infection.                                     Remember - BRUSH YOUR TEETH THE MORNING OF SURGERY WITH YOUR REGULAR TOOTHPASTE   Do NOT smoke after Midnight   Take these medicines the morning of surgery with A SIP OF WATER: Prilosec   Bring CPAP  mask and tubing day of surgery.                              You may not have any metal on your body including  jewelry, and body piercing             Do not wear lotions, powders, cologne, or deodorant               Men may shave face and neck.   Do not bring valuables to the hospital. [facility] IS NOT             RESPONSIBLE   FOR VALUABLES.   Contacts, dentures or bridgework may not be worn into surgery.   Bring small overnight bag day of surgery.   DO NOT BRING YOUR HOME MEDICATIONS TO THE HOSPITAL.    Patients discharged on the day of surgery will not be allowed to drive home.  Someone NEEDS to stay with you for the first 24 hours after anesthesia.   Special Instructions: Bring a copy of your healthcare power of attorney and living will documents  the day of surgery if you haven't scanned them before.              Please read over the following fact sheets you were given: IF YOU HAVE QUESTIONS ABOUT YOUR PRE-OP INSTRUCTIONS PLEASE CALL [phone]   English- Preparing for Total Shoulder Arthroplasty    Before surgery, you can play an important role. Because skin is not sterile, your skin needs to be as free of germs as possible. You can reduce the number of germs on your skin by using the following products. Benzoyl Peroxide Gel Reduces the number of germs present on the skin Applied twice a day to shoulder area starting two days before surgery    ==================================================================  Please follow these instructions carefully:  BENZOYL PEROXIDE 5% GEL  Please do not use if you have an allergy to benzoyl peroxide.   If your skin becomes reddened/irritated stop using the benzoyl peroxide.  Starting two  days before surgery, apply as follows: Apply benzoyl peroxide in the morning and at night. Apply after taking a shower. If you are not taking a shower clean entire shoulder front, back, and side along with the armpit with a clean wet washcloth.  Place a quarter-sized dollop on your shoulder and rub in thoroughly, making sure to cover the front, back, and side of your shoulder, along with the armpit.   2 days before ____ AM   ____ PM              1 day before ____ AM   ____ PM                         Do this twice a day for two days.  (Last application is the night before surgery, AFTER using the CHG soap as described below).  Do NOT apply benzoyl peroxide gel on the day of surgery.     [place] - Preparing for Surgery Before surgery, you can play an important role.  Because skin is not sterile, your skin needs to be as free of germs as possible.  You can reduce the number of germs on your skin by washing with CHG (chlorahexidine gluconate) soap before surgery.  CHG is an antiseptic cleaner which kills germs and bonds with the skin to continue killing  germs even after washing. Please DO NOT use if you have an allergy to CHG or antibacterial soaps.  If your skin becomes reddened/irritated stop using the CHG and inform your nurse when you arrive at Short Stay..  You may shave your face/neck. Please follow these instructions carefully:  1.  Shower with CHG Soap the night before surgery and the  morning of Surgery.  2.  If you choose to wash your hair, wash your hair first as usual with your  normal  shampoo.  3.  After you shampoo, rinse your hair and body thoroughly to remove the  shampoo.                            4.  Use CHG as you would any other liquid soap.  You can apply chg directly  to the skin and wash                       Gently with a scrungie or clean washcloth.  5.  Apply the CHG Soap to your body ONLY FROM THE NECK DOWN.   Do not use on face/ open                           Wound  or open sores. Avoid contact with eyes, ears mouth and genitals (private parts).                       Wash face,  Genitals (private parts) with your normal soap.             6.  Wash thoroughly, paying special attention to the area where your surgery  will be performed.  7.  Thoroughly rinse your body with warm water from the neck down.  8.  DO NOT shower/wash with your normal soap after using and rinsing off  the CHG Soap.                9.  Pat yourself dry with a clean towel.            10.  Wear clean pajamas.            11.  Place clean sheets on your bed the night of your first shower and do not  sleep with pets. Day of Surgery : Do not apply any lotions/deodorants the morning of surgery.  Please wear clean clothes to the hospital/surgery center.  FAILURE TO FOLLOW THESE INSTRUCTIONS MAY RESULT IN THE CANCELLATION OF YOUR SURGERY  ________________________________________________________________________  Incentive Spirometer  An incentive spirometer is a tool that can help keep your lungs clear and active. This tool measures how well you are filling your lungs with each breath. Taking long deep breaths may help reverse or decrease the chance of developing breathing (pulmonary) problems (especially infection) following: A long period of time when you are unable to move or be active. BEFORE THE PROCEDURE  If the spirometer includes an indicator to show your best effort, your nurse or respiratory therapist will set it to a desired goal. If possible, sit up straight or lean slightly forward. Try not to slouch. Hold the incentive spirometer in an upright position. INSTRUCTIONS FOR USE  Sit on the edge of your bed if possible, or sit up as far as you can in bed or on a chair. Hold the incentive spirometer in an upright position.  Breathe out normally. Place the mouthpiece in your mouth and seal your lips tightly around it. Breathe in slowly and as deeply as possible, raising the piston or  the ball toward the top of the column. Hold your breath for 3-5 seconds or for as long as possible. Allow the piston or ball to fall to the bottom of the column. Remove the mouthpiece from your mouth and breathe out normally. Rest for a few seconds and repeat Steps 1 through 7 at least 10 times every 1-2 hours when you are awake. Take your time and take a few normal breaths between deep breaths. The spirometer may include an indicator to show your best effort. Use the indicator as a goal to work toward during each repetition. After each set of 10 deep breaths, practice coughing to be sure your lungs are clear. If you have an incision (the cut made at the time of surgery), support your incision when coughing by placing a pillow or rolled up towels firmly against it. Once you are able to get out of bed, walk around indoors and cough well. You may stop using the incentive spirometer when instructed by your caregiver.  RISKS AND COMPLICATIONS Take your time so you do not get dizzy or light-headed. If you are in pain, you may need to take or ask for pain medication before doing incentive spirometry. It is harder to take a deep breath if you are having pain. AFTER USE Rest and breathe slowly and easily. It can be helpful to keep track of a log of your progress. Your caregiver can provide you with a simple table to help with this. If you are using the spirometer at home, follow these instructions: SEEK MEDICAL CARE IF:  You are having difficultly using the spirometer. You have trouble using the spirometer as often as instructed. Your pain medication is not giving enough relief while using the spirometer. You develop fever of 100.5 F (38.1 C) or higher. SEEK IMMEDIATE MEDICAL CARE IF:  You cough up bloody sputum that had not been present before. You develop fever of 102 F (38.9 C) or greater. You develop worsening pain at or near the incision site. MAKE SURE YOU:  Understand these  instructions. Will watch your condition. Will get help right away if you are not doing well or get worse. Document Released: 01/03/2007 Document Revised: 11/15/2011 Document Reviewed: 03/06/2007 [facility] Patient Information 2014 [place], [place].   ________________________________________________________________________

## 2022-08-06 ENCOUNTER — Encounter (HOSPITAL_COMMUNITY)
Admission: RE | Admit: 2022-08-06 | Discharge: 2022-08-06 | Disposition: A | Discharge status: Home / Self Care | Payer: 59 | Source: Ambulatory Visit | Attending: Orthopedic Surgery | Admitting: Orthopedic Surgery

## 2022-08-06 ENCOUNTER — Other Ambulatory Visit: Payer: Self-pay

## 2022-08-06 ENCOUNTER — Encounter (HOSPITAL_COMMUNITY): Payer: Self-pay

## 2022-08-06 DIAGNOSIS — I10 Essential (primary) hypertension: Secondary | ICD-10-CM | POA: Insufficient documentation

## 2022-08-06 DIAGNOSIS — Z01818 Encounter for other preprocedural examination: Secondary | ICD-10-CM | POA: Diagnosis present

## 2022-08-06 HISTORY — DX: Dorsalgia, unspecified: M54.9

## 2022-08-06 HISTORY — DX: Other specified dorsopathies, lumbar region: M53.86

## 2022-08-06 LAB — BASIC METABOLIC PANEL
Anion gap: 4 — ABNORMAL LOW (ref 5–15)
BUN: 19 mg/dL (ref 8–23)
CO2: 29 mmol/L (ref 22–32)
Calcium: 8.9 mg/dL (ref 8.9–10.3)
Chloride: 106 mmol/L (ref 98–111)
Creatinine, Ser: 0.97 mg/dL (ref 0.61–1.24)
GFR, Estimated: >60 mL/min (ref >60)
Glucose, Bld: 87 mg/dL (ref 70–99)
Potassium: 4.2 mmol/L (ref 3.5–5.1)
Sodium: 139 mmol/L (ref 135–145)

## 2022-08-06 LAB — CBC
HCT: 38.4 % — ABNORMAL LOW (ref 39.0–52.0)
Hemoglobin: 11.6 g/dL — ABNORMAL LOW (ref 13.0–17.0)
MCH: 24.9 pg — ABNORMAL LOW (ref 26.0–34.0)
MCHC: 30.2 g/dL (ref 30.0–36.0)
MCV: 82.6 fL (ref 80.0–100.0)
Platelets: 302 10*3/uL (ref 150–400)
RBC: 4.65 MIL/uL (ref 4.22–5.81)
RDW: 15.7 % — ABNORMAL HIGH (ref 11.5–15.5)
WBC: 4.9 10*3/uL (ref 4.0–10.5)
nRBC: 0 % (ref 0.0–0.2)

## 2022-08-06 LAB — SURGICAL PCR SCREEN
MRSA, PCR: NEGATIVE
Staphylococcus aureus: NEGATIVE

## 2022-08-06 NOTE — Progress Notes (Signed)
Anesthesia note:  Bowel prep reminder:  NA  PCP - Angela Adam NP Cardiologist -none Other-   Chest x-ray - no EKG - 08/06/22-chart Stress Test - no ECHO - no Cardiac Cath - no CABG-no Pacemaker/ICD device last checked:NA  Sleep Study - no CPAP - no  Pt is pre diabetic-no CBG at PAT visit- Fasting Blood Sugar at home- Checks Blood Sugar _____  Blood Thinner:no Blood Thinner Instructions: Aspirin Instructions: Last Dose:  Anesthesia review: /No    Patient denies shortness of breath, fever, cough and chest pain at PAT appointment Pt has no SOB with activities. He has back pain anf sciatica . 12 4 he is getting an injection in his back.  Patient verbalized understanding of instructions that were given to them at the PAT appointment. Patient was also instructed that they will need to review over the PAT instructions again at home before surgery.yes

## 2022-08-13 ENCOUNTER — Other Ambulatory Visit: Payer: Self-pay

## 2022-08-13 ENCOUNTER — Encounter (HOSPITAL_COMMUNITY): Payer: Self-pay | Admitting: Orthopedic Surgery

## 2022-08-13 ENCOUNTER — Ambulatory Visit (HOSPITAL_BASED_OUTPATIENT_CLINIC_OR_DEPARTMENT_OTHER): Payer: 59 | Admitting: Anesthesiology

## 2022-08-13 ENCOUNTER — Ambulatory Visit (HOSPITAL_COMMUNITY): Payer: 59

## 2022-08-13 ENCOUNTER — Ambulatory Visit (HOSPITAL_COMMUNITY)
Admission: RE | Admit: 2022-08-13 | Discharge: 2022-08-13 | Disposition: A | Discharge status: Home / Self Care | Payer: 59 | Source: Ambulatory Visit | Attending: Orthopedic Surgery | Admitting: Orthopedic Surgery

## 2022-08-13 ENCOUNTER — Encounter (HOSPITAL_COMMUNITY)
Admission: RE | Disposition: A | Discharge status: Home / Self Care | Payer: Self-pay | Source: Ambulatory Visit | Attending: Orthopedic Surgery

## 2022-08-13 ENCOUNTER — Ambulatory Visit (HOSPITAL_COMMUNITY): Payer: 59 | Admitting: Anesthesiology

## 2022-08-13 DIAGNOSIS — Z87891 Personal history of nicotine dependence: Secondary | ICD-10-CM | POA: Diagnosis not present

## 2022-08-13 DIAGNOSIS — M19012 Primary osteoarthritis, left shoulder: Secondary | ICD-10-CM

## 2022-08-13 DIAGNOSIS — M6289 Other specified disorders of muscle: Secondary | ICD-10-CM | POA: Diagnosis not present

## 2022-08-13 DIAGNOSIS — K219 Gastro-esophageal reflux disease without esophagitis: Secondary | ICD-10-CM | POA: Diagnosis not present

## 2022-08-13 DIAGNOSIS — M75102 Unspecified rotator cuff tear or rupture of left shoulder, not specified as traumatic: Secondary | ICD-10-CM | POA: Diagnosis not present

## 2022-08-13 DIAGNOSIS — M12812 Other specific arthropathies, not elsewhere classified, left shoulder: Secondary | ICD-10-CM | POA: Diagnosis not present

## 2022-08-13 DIAGNOSIS — Z79899 Other long term (current) drug therapy: Secondary | ICD-10-CM | POA: Insufficient documentation

## 2022-08-13 DIAGNOSIS — I10 Essential (primary) hypertension: Secondary | ICD-10-CM | POA: Insufficient documentation

## 2022-08-13 DIAGNOSIS — Z01818 Encounter for other preprocedural examination: Secondary | ICD-10-CM

## 2022-08-13 HISTORY — PX: REVERSE SHOULDER ARTHROPLASTY: SHX5054

## 2022-08-13 SURGERY — ARTHROPLASTY, SHOULDER, TOTAL, REVERSE
Anesthesia: Regional | Site: Shoulder | Laterality: Left

## 2022-08-13 MED ORDER — OXYCODONE-ACETAMINOPHEN 5-325 MG PO TABS: 1.0000 | ORAL_TABLET | ORAL | 0 refills | Status: DC | PRN

## 2022-08-13 MED ORDER — ROCURONIUM BROMIDE 10 MG/ML (PF) SYRINGE
PREFILLED_SYRINGE | INTRAVENOUS | Status: AC
  Filled 2022-08-13: qty 10

## 2022-08-13 MED ORDER — ORAL CARE MOUTH RINSE: 15.0000 mL | Freq: Once | OROMUCOSAL | Status: AC

## 2022-08-13 MED ORDER — AMISULPRIDE (ANTIEMETIC) 5 MG/2ML IV SOLN: 10.0000 mg | Freq: Once | INTRAVENOUS | Status: DC | PRN

## 2022-08-13 MED ORDER — FENTANYL CITRATE (PF) 100 MCG/2ML IJ SOLN
INTRAMUSCULAR | Status: AC
  Filled 2022-08-13: qty 2

## 2022-08-13 MED ORDER — PHENYLEPHRINE HCL-NACL 20-0.9 MG/250ML-% IV SOLN
INTRAVENOUS | Status: DC | PRN
  Administered 2022-08-13: 40 ug/min via INTRAVENOUS

## 2022-08-13 MED ORDER — EPHEDRINE SULFATE-NACL 50-0.9 MG/10ML-% IV SOSY
PREFILLED_SYRINGE | INTRAVENOUS | Status: DC | PRN
  Administered 2022-08-13 (×2): 5 mg via INTRAVENOUS
  Administered 2022-08-13 (×2): 10 mg via INTRAVENOUS
  Administered 2022-08-13: 5 mg via INTRAVENOUS

## 2022-08-13 MED ORDER — ONDANSETRON HCL 4 MG/2ML IJ SOLN
INTRAMUSCULAR | Status: AC
  Filled 2022-08-13: qty 2

## 2022-08-13 MED ORDER — OXYCODONE HCL 5 MG PO TABS: 5.0000 mg | ORAL_TABLET | Freq: Once | ORAL | Status: DC | PRN

## 2022-08-13 MED ORDER — ONDANSETRON HCL 4 MG/2ML IJ SOLN: 4.0000 mg | Freq: Once | INTRAMUSCULAR | Status: DC | PRN

## 2022-08-13 MED ORDER — ONDANSETRON HCL 4 MG PO TABS: 4.0000 mg | ORAL_TABLET | Freq: Three times a day (TID) | ORAL | 1 refills | Status: DC | PRN

## 2022-08-13 MED ORDER — BUPIVACAINE HCL (PF) 0.5 % IJ SOLN
INTRAMUSCULAR | Status: DC | PRN
  Administered 2022-08-13: 15 mL via PERINEURAL

## 2022-08-13 MED ORDER — MIDAZOLAM HCL 2 MG/2ML IJ SOLN
1.0000 mg | INTRAMUSCULAR | Status: DC
  Administered 2022-08-13: 1 mg via INTRAVENOUS
  Filled 2022-08-13: qty 2

## 2022-08-13 MED ORDER — DEXAMETHASONE SODIUM PHOSPHATE 10 MG/ML IJ SOLN
INTRAMUSCULAR | Status: DC | PRN
  Administered 2022-08-13: 10 mg via INTRAVENOUS

## 2022-08-13 MED ORDER — SUGAMMADEX SODIUM 200 MG/2ML IV SOLN
INTRAVENOUS | Status: DC | PRN
  Administered 2022-08-13 (×2): 100 mg via INTRAVENOUS

## 2022-08-13 MED ORDER — OXYCODONE HCL 5 MG/5ML PO SOLN: 5.0000 mg | Freq: Once | ORAL | Status: DC | PRN

## 2022-08-13 MED ORDER — SODIUM CHLORIDE 0.9 % IR SOLN
Status: DC | PRN
  Administered 2022-08-13: 1000 mL

## 2022-08-13 MED ORDER — BUPIVACAINE-EPINEPHRINE (PF) 0.5% -1:200000 IJ SOLN
INTRAMUSCULAR | Status: AC
  Filled 2022-08-13: qty 30

## 2022-08-13 MED ORDER — ACETAMINOPHEN 10 MG/ML IV SOLN: 1000.0000 mg | Freq: Once | INTRAVENOUS | Status: DC | PRN

## 2022-08-13 MED ORDER — PHENYLEPHRINE HCL (PRESSORS) 10 MG/ML IV SOLN
INTRAVENOUS | Status: AC
  Filled 2022-08-13: qty 1

## 2022-08-13 MED ORDER — ONDANSETRON HCL 4 MG/2ML IJ SOLN
INTRAMUSCULAR | Status: DC | PRN
  Administered 2022-08-13: 4 mg via INTRAVENOUS

## 2022-08-13 MED ORDER — HYDROMORPHONE HCL 1 MG/ML IJ SOLN: 0.2500 mg | INTRAMUSCULAR | Status: DC | PRN

## 2022-08-13 MED ORDER — CHLORHEXIDINE GLUCONATE 0.12 % MT SOLN
15.0000 mL | Freq: Once | OROMUCOSAL | Status: AC
  Administered 2022-08-13: 15 mL via OROMUCOSAL

## 2022-08-13 MED ORDER — LIDOCAINE HCL (PF) 2 % IJ SOLN
INTRAMUSCULAR | Status: AC
  Filled 2022-08-13: qty 5

## 2022-08-13 MED ORDER — FENTANYL CITRATE (PF) 100 MCG/2ML IJ SOLN
INTRAMUSCULAR | Status: DC | PRN
  Administered 2022-08-13 (×2): 50 ug via INTRAVENOUS

## 2022-08-13 MED ORDER — LACTATED RINGERS IV SOLN: INTRAVENOUS | Status: DC

## 2022-08-13 MED ORDER — METHOCARBAMOL 500 MG PO TABS: 500.0000 mg | ORAL_TABLET | Freq: Three times a day (TID) | ORAL | 1 refills | Status: DC | PRN

## 2022-08-13 MED ORDER — PROPOFOL 10 MG/ML IV BOLUS
INTRAVENOUS | Status: DC | PRN
  Administered 2022-08-13: 50 mg via INTRAVENOUS
  Administered 2022-08-13: 120 mg via INTRAVENOUS

## 2022-08-13 MED ORDER — PHENYLEPHRINE 80 MCG/ML (10ML) SYRINGE FOR IV PUSH (FOR BLOOD PRESSURE SUPPORT)
PREFILLED_SYRINGE | INTRAVENOUS | Status: DC | PRN
  Administered 2022-08-13 (×3): 160 ug via INTRAVENOUS

## 2022-08-13 MED ORDER — ROCURONIUM BROMIDE 10 MG/ML (PF) SYRINGE
PREFILLED_SYRINGE | INTRAVENOUS | Status: DC | PRN
  Administered 2022-08-13: 60 mg via INTRAVENOUS

## 2022-08-13 MED ORDER — BUPIVACAINE-EPINEPHRINE 0.5% -1:200000 IJ SOLN
INTRAMUSCULAR | Status: DC | PRN
  Administered 2022-08-13: 11 mL

## 2022-08-13 MED ORDER — FENTANYL CITRATE PF 50 MCG/ML IJ SOSY
50.0000 ug | PREFILLED_SYRINGE | INTRAMUSCULAR | Status: DC
  Administered 2022-08-13: 50 ug via INTRAVENOUS
  Filled 2022-08-13: qty 2

## 2022-08-13 MED ORDER — BUPIVACAINE LIPOSOME 1.3 % IJ SUSP
INTRAMUSCULAR | Status: DC | PRN
  Administered 2022-08-13: 10 mL via PERINEURAL

## 2022-08-13 MED ORDER — LIDOCAINE 2% (20 MG/ML) 5 ML SYRINGE
INTRAMUSCULAR | Status: DC | PRN
  Administered 2022-08-13: 100 mg via INTRAVENOUS

## 2022-08-13 MED ORDER — CEFAZOLIN SODIUM-DEXTROSE 2-4 GM/100ML-% IV SOLN
2.0000 g | INTRAVENOUS | Status: AC
  Administered 2022-08-13: 2 g via INTRAVENOUS
  Filled 2022-08-13: qty 100

## 2022-08-13 MED ORDER — STERILE WATER FOR IRRIGATION IR SOLN
Status: DC | PRN
  Administered 2022-08-13: 2000 mL

## 2022-08-13 MED ORDER — DEXAMETHASONE SODIUM PHOSPHATE 10 MG/ML IJ SOLN
INTRAMUSCULAR | Status: AC
  Filled 2022-08-13: qty 1

## 2022-08-13 SURGICAL SUPPLY — 74 items
AID PSTN UNV HD RSTRNT DISP (MISCELLANEOUS) ×1
BAG COUNTER SPONGE SURGICOUNT (BAG) IMPLANT
BAG SPEC THK2 15X12 ZIP CLS (MISCELLANEOUS)
BAG SPNG CNTER NS LX DISP (BAG)
BAG ZIPLOCK 12X15 (MISCELLANEOUS) IMPLANT
BIT DRILL 1.6MX128 (BIT) IMPLANT
BIT DRILL 170X2.5X (BIT) IMPLANT
BIT DRL 170X2.5X (BIT) ×1
BLADE SAG 18X100X1.27 (BLADE) ×2 IMPLANT
COVER BACK TABLE 60X90IN (DRAPES) ×2 IMPLANT
COVER SURGICAL LIGHT HANDLE (MISCELLANEOUS) ×2 IMPLANT
CUP HUMERAL 42 PLUS 3 (Orthopedic Implant) IMPLANT
DRAPE INCISE IOBAN 66X45 STRL (DRAPES) ×2 IMPLANT
DRAPE ORTHO SPLIT 77X108 STRL (DRAPES) ×2
DRAPE SHEET LG 3/4 BI-LAMINATE (DRAPES) ×2 IMPLANT
DRAPE SURG ORHT 6 SPLT 77X108 (DRAPES) ×4 IMPLANT
DRAPE TOP 10253 STERILE (DRAPES) ×2 IMPLANT
DRAPE U-SHAPE 47X51 STRL (DRAPES) ×2 IMPLANT
DRILL 2.5 (BIT) ×1
DRSG ADAPTIC 3X8 NADH LF (GAUZE/BANDAGES/DRESSINGS) ×2 IMPLANT
DURAPREP 26ML APPLICATOR (WOUND CARE) ×2 IMPLANT
ELECT BLADE TIP CTD 4 INCH (ELECTRODE) ×2 IMPLANT
ELECT NDL TIP 2.8 STRL (NEEDLE) ×2 IMPLANT
ELECT NEEDLE TIP 2.8 STRL (NEEDLE) ×1 IMPLANT
ELECT REM PT RETURN 15FT ADLT (MISCELLANEOUS) ×2 IMPLANT
EPIPHSYSI CENTER SZ 2 LT (Shoulder) ×1 IMPLANT
EPIPHYSIS CENTER SZ 2 LT (Shoulder) IMPLANT
FACESHIELD WRAPAROUND (MASK) ×1 IMPLANT
FACESHIELD WRAPAROUND OR TEAM (MASK) ×2 IMPLANT
GAUZE PAD ABD 8X10 STRL (GAUZE/BANDAGES/DRESSINGS) ×2 IMPLANT
GAUZE SPONGE 4X4 12PLY STRL (GAUZE/BANDAGES/DRESSINGS) ×2 IMPLANT
GLENOSPHERE XTEND LAT 42+0 STD (Miscellaneous) IMPLANT
GLOVE BIOGEL PI IND STRL 7.5 (GLOVE) ×2 IMPLANT
GLOVE BIOGEL PI IND STRL 8.5 (GLOVE) ×2 IMPLANT
GLOVE ORTHO TXT STRL SZ7.5 (GLOVE) ×2 IMPLANT
GLOVE SURG ORTHO 8.5 STRL (GLOVE) ×2 IMPLANT
GOWN STRL REUS W/ TWL XL LVL3 (GOWN DISPOSABLE) ×4 IMPLANT
GOWN STRL REUS W/TWL XL LVL3 (GOWN DISPOSABLE) ×2
KIT BASIN OR (CUSTOM PROCEDURE TRAY) ×2 IMPLANT
KIT TURNOVER KIT A (KITS) IMPLANT
MANIFOLD NEPTUNE II (INSTRUMENTS) ×2 IMPLANT
METAGLENE DELTA EXTEND (Trauma) IMPLANT
METAGLENE DXTEND (Trauma) ×1 IMPLANT
NDL MAYO CATGUT SZ4 TPR NDL (NEEDLE) ×2 IMPLANT
NEEDLE MAYO CATGUT SZ4 (NEEDLE) ×1 IMPLANT
NS IRRIG 1000ML POUR BTL (IV SOLUTION) ×2 IMPLANT
PACK SHOULDER (CUSTOM PROCEDURE TRAY) ×2 IMPLANT
PIN GUIDE 1.2 (PIN) IMPLANT
PIN GUIDE GLENOPHERE 1.5MX300M (PIN) IMPLANT
PIN METAGLENE 2.5 (PIN) IMPLANT
PROTECTOR NERVE ULNAR (MISCELLANEOUS) ×2 IMPLANT
RESTRAINT HEAD UNIVERSAL NS (MISCELLANEOUS) ×2 IMPLANT
SCREW 4.5X18MM (Screw) ×1 IMPLANT
SCREW 4.5X36MM (Screw) IMPLANT
SCREW BN 18X4.5XSTRL SHLDR (Screw) IMPLANT
SCREW LOCK 42 (Screw) IMPLANT
SCREW LOCK DELTA XTEND 4.5X30 (Screw) IMPLANT
SLING ARM FOAM STRAP LRG (SOFTGOODS) IMPLANT
SMARTMIX MINI TOWER (MISCELLANEOUS)
SPIKE FLUID TRANSFER (MISCELLANEOUS) ×2 IMPLANT
SPONGE T-LAP 4X18 ~~LOC~~+RFID (SPONGE) ×2 IMPLANT
STEM 12 HA (Stem) IMPLANT
STRIP CLOSURE SKIN 1/2X4 (GAUZE/BANDAGES/DRESSINGS) ×2 IMPLANT
SUCTION FRAZIER HANDLE 10FR (MISCELLANEOUS) ×1
SUCTION TUBE FRAZIER 10FR DISP (MISCELLANEOUS) ×2 IMPLANT
SUT FIBERWIRE #2 38 T-5 BLUE (SUTURE) ×2
SUT MNCRL AB 4-0 PS2 18 (SUTURE) ×2 IMPLANT
SUT VIC AB 0 CT1 36 (SUTURE) ×4 IMPLANT
SUT VIC AB 0 CT2 27 (SUTURE) ×2 IMPLANT
SUT VIC AB 2-0 CT1 27 (SUTURE) ×1
SUT VIC AB 2-0 CT1 TAPERPNT 27 (SUTURE) ×2 IMPLANT
SUTURE FIBERWR #2 38 T-5 BLUE (SUTURE) ×4 IMPLANT
TOWEL OR 17X26 10 PK STRL BLUE (TOWEL DISPOSABLE) ×2 IMPLANT
TOWER SMARTMIX MINI (MISCELLANEOUS) IMPLANT

## 2022-08-13 NOTE — Anesthesia Procedure Notes (Signed)
Procedure Name: Intubation Date/Time: 08/13/2022 10:07 AM  Performed by: Pearson Grippe, CRNAPre-anesthesia Checklist: Patient identified, Emergency Drugs available, Suction available and Patient being monitored Patient Re-evaluated:Patient Re-evaluated prior to induction Oxygen Delivery Method: Circle system utilized Preoxygenation: Pre-oxygenation with 100% oxygen Induction Type: IV induction Ventilation: Mask ventilation without difficulty Laryngoscope Size: Miller and 2 Grade View: Grade II Tube type: Oral Tube size: 7.5 mm Number of attempts: 1 Airway Equipment and Method: Stylet and Oral airway Placement Confirmation: ETT inserted through vocal cords under direct vision, positive ETCO2 and breath sounds checked- equal and bilateral Secured at: 22 cm Tube secured with: Tape Dental Injury: Teeth and Oropharynx as per pre-operative assessment  Comments: Anterior glottis, grade 2b-3

## 2022-08-13 NOTE — Transfer of Care (Signed)
Immediate Anesthesia Transfer of Care Note  Patient: [name]  Procedure(s) Performed: REVERSE SHOULDER ARTHROPLASTY (Left: Shoulder)  Patient Location: PACU  Anesthesia Type:GA combined with regional for post-op pain  Level of Consciousness: awake, alert , and oriented  Airway & Oxygen Therapy: Patient Spontanous Breathing and Patient connected to face mask oxygen  Post-op Assessment: Report given to RN and Post -op Vital signs reviewed and stable  Post vital signs: Reviewed and stable  Last Vitals:  Vitals Value Taken Time  BP 142/82 08/13/22 1226  Temp    Pulse 80 08/13/22 1227  Resp 25 08/13/22 1227  SpO2 98 % 08/13/22 1227  Vitals shown include unvalidated device data.  Last Pain:  Vitals:   08/13/22 0915  TempSrc:   PainSc: 0-No pain         Complications: No notable events documented.

## 2022-08-13 NOTE — Op Note (Signed)
[name], [name] MEDICAL RECORD NO: [id number] ACCOUNT NO: [id number] DATE OF BIRTH: 09-Jan-1956 FACILITY: [facility] LOCATION: [facility] PHYSICIAN: Almedia Balls. Ranell Patrick, MD  Operative Report   DATE OF PROCEDURE: 08/13/2022  PREOPERATIVE DIAGNOSIS:  Left shoulder end-stage arthritis with rotator cuff insufficiency.  POSTOPERATIVE DIAGNOSIS:  Left shoulder end-stage arthritis with rotator cuff insufficiency.  PROCEDURE PERFORMED:  Left reverse shoulder replacement using DePuy Delta Xtend prosthesis with no subscap repair.  ATTENDING SURGEON:  Almedia Balls. Ranell Patrick, MD  ASSISTANT:  Konrad Felix Dixon, [place], who was scrubbed during the entire procedure, and necessary for satisfactory completion of surgery.  ANESTHESIA:  General anesthesia was used plus interscalene block.  ESTIMATED BLOOD LOSS:  200 mL  FLUID REPLACEMENT:  1500 mL crystalloid.  COUNTS:  Instrument counts correct.  COMPLICATIONS:  No complications.  ANTIBIOTICS:  Perioperative antibiotics were given.  INDICATIONS:  The patient is a 66 year old male who presents with worsening left shoulder pain and dysfunction secondary to end-stage arthritis with rotator cuff insufficiency.  The patient has failed an extended period of conservative management,  desires operative treatment to eliminate pain and restore function.  Informed consent obtained.  DESCRIPTION OF PROCEDURE:  After an adequate level of anesthesia was achieved, the patient was positioned in modified beach chair position.  Left shoulder correctly identified and sterilely prepped and draped in the usual manner.  Timeout called,  verifying correct patient, correct site, we entered the patient's shoulder using a standard deltopectoral approach, starting the coracoid process extending down to the anterior humerus.  Dissection down through subcutaneous tissues using Bovie.  We  identified the cephalic vein and took that laterally with the deltoid, pectoralis taken medially.   Conjoined tendon identified and retracted medially.  We identified the biceps tendon and tenodesed the biceps in situ with 0 Vicryl figure-of-eight suture  x2 incorporating part of the pectoralis tendon.  We then released the subscapularis subperiosteally off the lesser tuberosity and tagged for protection of the axillary nerve.  We were not going to repair this tendon as it was extensively damaged and  there was muscle atrophy.  We then released the inferior capsule and also the remaining rotator cuff and extended the shoulder delivering the humeral head out of the wound.  There were large osteophytes, which were removed with a rongeur.  We then  entered the proximal humerus with a 6 mm reamer, reaming up to a size 12 and then used the 12 mm T-handled intramedullary guide to resect the head at 20 degrees of retroversion with the oscillating saw, we removed excess osteophytes with a rongeur.  We  then subluxed the humerus posteriorly, gaining good exposure of the glenoid face.  We removed the large osteophytes posteriorly and anteriorly.  We then removed the capsule and labrum, protecting the axillary nerve.  We got our deep retractors placed.   We found our center point for a guide pin low on the glenoid face and then placed our guide pin in the proper position.  We then reamed for the metaglene baseplate getting down to subchondral bone and correcting some of the retroversion.  We then did our  peripheral T-handled reamer and then drilled our central peg hole impacted the metaglene baseplate into position with good support.  We then placed a 36 screw inferiorly, a 30 screw superiorly and an 18 nonlocked screw anteriorly.  We locked the  superior and inferior screws.  We had good baseplate support and stability.  We then selected a 42+0 standard glenosphere and attached  that to the baseplate using the screwdriver.  Once that was in place, I did a finger sweep to make sure there is no  soft tissue caught up  in that bearing.  We then went to the humeral side and reamed for the 2 left metaphysis and then trialled with the 12 stem 2 left metaphysis set on the 0 setting and placed in 20 degrees of retroversion.  We used a 42+3 poly trial  on the humeral tray and reduced the shoulder.  We were pleased with conjoined tendon tensioning and our soft tissue tensioning and stability.  We removed all the trial components from the humeral side and irrigated thoroughly.  We then used available  bone graft from the humeral head in impaction grafting technique with the HA press-fit 12 stem and the 2 left metaphysis.  Once again set on the 0 setting and impacted in 20 degrees of retroversion.  We selected the 42+3 real poly placed on the humeral  tray and impacted that in position.  We then reduced the shoulder.  Nice little pop as it reduced.  Appropriate tensioning on the conjoined tendon.  No gapping with inferior pole or external rotation.  We irrigated thoroughly. I resected the subscap  remnant.  We then repaired the deltopectoral interval with 0 Vicryl suture followed by 2-0 Vicryl for subcutaneous closure and 4-0 Monocryl for skin and then Steri-Strips and sterile dressing.  The patient was placed in a shoulder sling and taken to  recovery room in stable condition.   PUS D: 08/13/2022 12:42:09 pm T: 08/13/2022 1:44:00 pm  JOB: [id number]/ [id number]

## 2022-08-13 NOTE — Anesthesia Procedure Notes (Signed)
Anesthesia Regional Block: Interscalene brachial plexus block   Pre-Anesthetic Checklist: , timeout performed,  Correct Patient, Correct Site, Correct Laterality,  Correct Procedure, Correct Position, site marked,  Risks and benefits discussed,  Surgical consent,  Pre-op evaluation,  At surgeon's request and post-op pain management  Laterality: Upper and Left  Prep: Maximum Sterile Barrier Precautions used, chloraprep       Needles:  Injection technique: Single-shot  Needle Type: Echogenic Needle     Needle Length: 5cm  Needle Gauge: 21     Additional Needles:   Procedures:,,,, ultrasound used (permanent image in chart),,    Narrative:  Start time: 08/13/2022 8:41 AM End time: 08/13/2022 8:48 AM Injection made incrementally with aspirations every 5 mL.  Performed by: Personally  Anesthesiologist: Trevor Iha, MD  Additional Notes: Block assessed prior to procedure. Patient tolerated procedure well.

## 2022-08-13 NOTE — Anesthesia Preprocedure Evaluation (Addendum)
Anesthesia Evaluation  Patient identified by MRN, date of birth, ID band Patient awake    Reviewed: Allergy & Precautions, NPO status , Patient's Chart, lab work & pertinent test results  Airway Mallampati: II  TM Distance: >3 FB Neck ROM: Full    Dental no notable dental hx. (+) Teeth Intact, Dental Advisory Given   Pulmonary former smoker   Pulmonary exam normal breath sounds clear to auscultation       Cardiovascular hypertension, Pt. on medications Normal cardiovascular exam Rhythm:Regular Rate:Normal     Neuro/Psych  negative psych ROS   GI/Hepatic Neg liver ROS,GERD  ,,  Endo/Other  negative endocrine ROS    Renal/GU Lab Results      Component                Value               Date                      CREATININE               0.97                08/06/2022                    K                        4.2                 08/06/2022                  Musculoskeletal  (+) Arthritis ,    Abdominal   Peds  Hematology Lab Results      Component                Value               Date                      WBC                      4.9                 08/06/2022                HGB                      11.6 (L)            08/06/2022                HCT                      38.4 (L)            08/06/2022                MCV                      82.6                08/06/2022                PLT                      302  08/06/2022              Anesthesia Other Findings   Reproductive/Obstetrics                             Anesthesia Physical Anesthesia Plan  ASA: 2  Anesthesia Plan: General and Regional   Post-op Pain Management: Regional block* and Minimal or no pain anticipated   Induction: Intravenous  PONV Risk Score and Plan: 3 and Treatment may vary due to age or medical condition, Midazolam and Ondansetron  Airway Management Planned: Oral ETT  Additional  Equipment: None  Intra-op Plan:   Post-operative Plan: Extubation in OR  Informed Consent: I have reviewed the patients History and Physical, chart, labs and discussed the procedure including the risks, benefits and alternatives for the proposed anesthesia with the patient or authorized representative who has indicated his/her understanding and acceptance.     Dental advisory given  Plan Discussed with: CRNA, Anesthesiologist and Surgeon  Anesthesia Plan Comments: (GA w R ISB)        Anesthesia Quick Evaluation

## 2022-08-13 NOTE — Brief Op Note (Signed)
08/13/2022  12:36 PM  PATIENT:  [name]  66 y.o. male  PRE-OPERATIVE DIAGNOSIS:  left shoulder cuff arthropathy  POST-OPERATIVE DIAGNOSIS:  left shoulder cuff arthropathy  PROCEDURE:  Procedure(s) with comments: REVERSE SHOULDER ARTHROPLASTY (Left) - with ISB DePuy Delta Xtend with NO subscap repair  SURGEON:  Surgeon(s) and Role:    Beverely Low, MD - Primary  PHYSICIAN ASSISTANT:   ASSISTANTS: Thea Gist, PA-C   ANESTHESIA:   regional and general  EBL:  200 mL   BLOOD ADMINISTERED:none  DRAINS: none   LOCAL MEDICATIONS USED:  MARCAINE     SPECIMEN:  No Specimen  DISPOSITION OF SPECIMEN:  N/A  COUNTS:  YES  TOURNIQUET:  * No tourniquets in log *  DICTATION: .Dictation [id number]  PLAN OF CARE: Discharge to home after PACU  PATIENT DISPOSITION:  PACU - hemodynamically stable.   Delay start of Pharmacological VTE agent (>24hrs) due to surgical blood loss or risk of bleeding: not applicable

## 2022-08-13 NOTE — Anesthesia Postprocedure Evaluation (Signed)
Anesthesia Post Note  Patient: [name]  Procedure(s) Performed: REVERSE SHOULDER ARTHROPLASTY (Left: Shoulder)     Patient location during evaluation: PACU Anesthesia Type: Regional Level of consciousness: awake and alert Pain management: pain level controlled Vital Signs Assessment: post-procedure vital signs reviewed and stable Respiratory status: spontaneous breathing, nonlabored ventilation, respiratory function stable and patient connected to nasal cannula oxygen Cardiovascular status: blood pressure returned to baseline and stable Postop Assessment: no apparent nausea or vomiting Anesthetic complications: no  No notable events documented.  Last Vitals:  Vitals:   08/13/22 1315 08/13/22 1330  BP: 126/83 126/82  Pulse: 82 86  Resp: 15 16  Temp: 36.5 C 36.4 C  SpO2: 93% 96%    Last Pain:  Vitals:   08/13/22 1330  TempSrc:   PainSc: 0-No pain                 Trevor Iha

## 2022-08-13 NOTE — Interval H&P Note (Signed)
History and Physical Interval Note:  08/13/2022 9:27 AM  [name]  has presented today for surgery, with the diagnosis of left shoulder cuff arthropathy.  The various methods of treatment have been discussed with the patient and family. After consideration of risks, benefits and other options for treatment, the patient has consented to  Procedure(s) with comments: REVERSE SHOULDER ARTHROPLASTY (Left) - with ISB as a surgical intervention.  The patient's history has been reviewed, patient examined, no change in status, stable for surgery.  I have reviewed the patient's chart and labs.  Questions were answered to the patient's satisfaction.     Verlee Rossetti

## 2022-08-13 NOTE — Discharge Instructions (Signed)
Ice to the shoulder constantly.  Keep the incision covered and clean and dry for one week, then ok to get it wet in the shower. Leave the incision uncovered after one week to get air.   Do exercise as instructed several times per day. Keep your arm in front of you.   DO NOT reach behind your back or push up out of a chair with the operative arm.   Use a sling while you are up and around for comfort, may remove while seated.  Keep pillow propped behind the operative elbow. Make sure that you can see your elbow at all times.   Follow up with Dr Ranell Patrick in two weeks in the office, call [phone] for appt  Call Dr Ranell Patrick at [phone] with any questions or concerns

## 2022-08-13 NOTE — Evaluation (Signed)
Occupational Therapy Evaluation Patient Details Name: [name] MRN: [id number] DOB: 08/15/1956 Today's Date: 08/13/2022   History of Present Illness [name] is a 66 yr old male who is s/p a L reverse shoulder replacement on 08-13-2022, due to end stage arthritis and rotator cuff insufficiency.   Clinical Impression   Patient is s/p shoulder replacement without functional use of left non-dominant upper extremity secondary to effects of surgery, interscalene block ,and shoulder precautions. Therapist provided education and instruction to patient and spouse with regards to ROM exercises, post-op precautions, UE positioning, donning upper extremity clothing, use of ice for pain and edema management, and correctly donning/doffing sling. Patient and spouse verbalized understanding and demonstrated understanding as needed. Patient needed assistance to donn shirt, underwear, pants, and shoes, with instruction provided on compensatory strategies to perform ADLs. Patient to follow up with MD for further therapy needs.        Recommendations for follow up therapy are one component of a multi-disciplinary discharge planning process, led by the attending physician.  Recommendations may be updated based on patient status, additional functional criteria and insurance authorization.   Follow Up Recommendations  Follow physician's recommendations for discharge plan and follow up therapies     Assistance Recommended at Discharge Intermittent Supervision/Assistance  Patient can return home with the following Assist for transportation;Assistance with cooking/housework;Help with stairs or ramp for entrance;A little help with bathing/dressing/bathroom    Functional Status Assessment  Patient has had a recent decline in their functional status and demonstrates the ability to make significant improvements in function in a reasonable and predictable amount of time.  Equipment Recommendations  None  recommended by OT       Precautions / Restrictions Precautions Precautions: Shoulder Shoulder Interventions: Shoulder sling/immobilizer Precaution Comments: sling on at all times except ADLs and exercise, LUE NWB, Okay to perform L hand, wrist, and elbow AROM, no AROM or PROM of shoulder Required Braces or Orthoses: Sling Restrictions Weight Bearing Restrictions: Yes LUE Weight Bearing: Non weight bearing      Mobility Bed Mobility      General bed mobility comments: pt was received seated in the bedside chair    Transfers Overall transfer level: Needs assistance   Transfers: Sit to/from Stand Sit to Stand: Supervision               ADL either performed or assessed with clinical judgement      Vision Patient Visual Report: No change from baseline              Pertinent Vitals/Pain Pain Assessment Pain Assessment: No/denies pain     Hand Dominance Right      Communication Communication Communication: No difficulties   Cognition Arousal/Alertness: Awake/alert Behavior During Therapy: WFL for tasks assessed/performed Overall Cognitive Status: Within Functional Limits for tasks assessed        General Comments: Oriented x4, able to follow commands without difficulty           Shoulder Instructions Shoulder Instructions Donning/doffing shirt without moving shoulder: Minimal assistance Donning/doffing sling/immobilizer: Minimal assistance Correct positioning of sling/immobilizer: Minimal assistance ROM for elbow, wrist and digits of operated UE: Patient able to independently direct caregiver Sling wearing schedule (on at all times/off for ADL's): Patient able to independently direct caregiver Positioning of UE while sleeping: Patient able to independently direct caregiver    Home Living Family/patient expects to be discharged to:: Private residence Living Arrangements: Spouse/significant other   Type of Home: House Home Access: Stairs to  enter Entrance Stairs-Number of Steps: 2 Entrance Stairs-Rails: None Home Layout: One level     Bathroom Shower/Tub: Walk-in shower;Tub/shower unit         Home Equipment: Cane - single point;Crutches          Prior Functioning/Environment Prior Level of Function : Independent/Modified Independent             Mobility Comments: Independent with ambulation ADLs Comments: Independent with ADLs and working full-time.        OT Problem List: Impaired UE functional use            OT Frequency:  N/A       AM-PAC OT "6 Clicks" Daily Activity     Outcome Measure Help from another person eating meals?: None Help from another person taking care of personal grooming?: None Help from another person toileting, which includes using toliet, bedpan, or urinal?: A Little Help from another person bathing (including washing, rinsing, drying)?: A Little Help from another person to put on and taking off regular upper body clothing?: A Little Help from another person to put on and taking off regular lower body clothing?: A Little 6 Click Score: 20   End of Session Nurse Communication: Other (comment) (nurse informed of shoulder instruction completed)  Activity Tolerance: Patient tolerated treatment well Patient left: in chair;with family/visitor present  OT Visit Diagnosis: Muscle weakness (generalized) (M62.81)                Time: 4098-1191 OT Time Calculation (min): 20 min Charges:  OT General Charges $OT Visit: 1 Visit OT Evaluation $OT Eval Low Complexity: 1 Low   Kahlil Cowans L Tisa Weisel, OTR/L 08/13/2022, 2:50 PM

## 2022-08-18 ENCOUNTER — Encounter (HOSPITAL_COMMUNITY): Payer: Self-pay | Admitting: Orthopedic Surgery

## 2022-12-24 ENCOUNTER — Encounter: Payer: Self-pay | Admitting: Adult Health

## 2022-12-24 ENCOUNTER — Ambulatory Visit: Payer: 59 | Admitting: Adult Health

## 2022-12-24 VITALS — BP 140/90 | HR 73 | Temp 98.1°F | Ht 77.0 in | Wt 239.0 lb

## 2022-12-24 DIAGNOSIS — H6123 Impacted cerumen, bilateral: Secondary | ICD-10-CM | POA: Diagnosis not present

## 2022-12-24 NOTE — Progress Notes (Signed)
Subjective:    Patient ID: [name], male    DOB: [birth year], 67 y.o.   MRN: [id number]  Ear Fullness    67 year old male who  has a past medical history of Arthritis, Back pain, GERD (gastroesophageal reflux disease), Hypertension, and Sciatica associated with disorder of lumbar spine.  He presents to the office today for the feeling of both of his ears being clogged. He has hearing loss. No pain or drainage from ears.      Review of Systems See HPI   Past Medical History:  Diagnosis Date   Arthritis    spine,hips,shoulders   Back pain    injection 08/09/22   GERD (gastroesophageal reflux disease)    Hypertension    Sciatica associated with disorder of lumbar spine     Social History   Socioeconomic History   Marital status: Married    Spouse name: Not on file   Number of children: Not on file   Years of education: Not on file   Highest education level: 12th grade  Occupational History   Not on file  Tobacco Use   Smoking status: Former    Packs/day: 2.00    Years: 23.00    Additional pack years: 0.00    Total pack years: 46.00    Types: Cigarettes    Quit date: 01/12/1991    Years since quitting: 31.9   Smokeless tobacco: Never  Vaping Use   Vaping Use: Never used  Substance and Sexual Activity   Alcohol use: No   Drug use: No   Sexual activity: Not on file  Other Topics Concern   Not on file  Social History Narrative   Not on file   Social Determinants of Health   Financial Resource Strain: Low Risk  (12/23/2022)   Overall Financial Resource Strain (CARDIA)    Difficulty of Paying Living Expenses: Not hard at all  Food Insecurity: No Food Insecurity (12/23/2022)   Hunger Vital Sign    Worried About Running Out of Food in the Last Year: Never true    Ran Out of Food in the Last Year: Never true  Transportation Needs: No Transportation Needs (12/23/2022)   PRAPARE - Administrator, Civil Service (Medical): No    Lack of  Transportation (Non-Medical): No  Physical Activity: Sufficiently Active (12/23/2022)   Exercise Vital Sign    Days of Exercise per Week: 6 days    Minutes of Exercise per Session: 30 min  Stress: No Stress Concern Present (12/23/2022)   [employer] of Occupational Health - Occupational Stress Questionnaire    Feeling of Stress : Not at all  Social Connections: Socially Integrated (12/23/2022)   Social Connection and Isolation Panel [NHANES]    Frequency of Communication with Friends and Family: Three times a week    Frequency of Social Gatherings with Friends and Family: Once a week    Attends Religious Services: 1 to 4 times per year    Active Member of Clubs or Organizations: Yes    Attends Banker Meetings: 1 to 4 times per year    Marital Status: Married  Catering manager Violence: Not on file    Past Surgical History:  Procedure Laterality Date   KNEE ARTHROPLASTY Right 2014   KNEE ARTHROPLASTY Left 2012   KNEE ARTHROSCOPY Left 2011   REVERSE SHOULDER ARTHROPLASTY Left 08/13/2022   Procedure: REVERSE SHOULDER ARTHROPLASTY;  Surgeon: Beverely Low, MD;  Location: [facility];  Service:  Orthopedics;  Laterality: Left;  with ISB   REVISION TOTAL KNEE ARTHROPLASTY Left 2016   SHOULDER SURGERY Left 2011    Family History  Problem Relation Age of Onset   Colon polyps Neg Hx    Rectal cancer Neg Hx    Stomach cancer Neg Hx    Esophageal cancer Neg Hx     No Known Allergies  Current Outpatient Medications on File Prior to Visit  Medication Sig Dispense Refill   Aspirin-Caffeine (BC FAST PAIN RELIEF ARTHRITIS) 1000-65 MG PACK Take 1 packet by mouth 2 (two) times daily as needed (pain).     doxazosin (CARDURA) 8 MG tablet TAKE 1 TABLET BY MOUTH EVERYDAY AT BEDTIME 90 tablet 3   enalapril (VASOTEC) 20 MG tablet Take 1 tablet (20 mg total) by mouth daily. 90 tablet 3   furosemide (LASIX) 40 MG tablet Take 1 tablet (40 mg total) by mouth 2 (two) times daily. (Patient  taking differently: Take 40 mg by mouth daily.) 180 tablet 3   methocarbamol (ROBAXIN) 500 MG tablet Take 1 tablet (500 mg total) by mouth every 8 (eight) hours as needed for muscle spasms. 40 tablet 1   Multiple Vitamin (MULTI-VITAMIN) tablet Take 1 tablet by mouth daily.     Multiple Vitamins-Minerals (CENTRUM SILVER 50+MEN PO) Take 1 tablet by mouth daily.     omeprazole (PRILOSEC) 20 MG capsule Take 1 capsule (20 mg total) by mouth daily. 90 capsule 3   ondansetron (ZOFRAN) 4 MG tablet Take 1 tablet (4 mg total) by mouth every 8 (eight) hours as needed for nausea, vomiting or refractory nausea / vomiting. 30 tablet 1   oxyCODONE-acetaminophen (PERCOCET) 5-325 MG tablet Take 1 tablet by mouth every 4 (four) hours as needed for severe pain or moderate pain. 30 tablet 0   sildenafil (VIAGRA) 100 MG tablet take half to one tablet by mouth daily as needed for erectile dysfunction 90 tablet 1   fexofenadine (ALLEGRA) 180 MG tablet Take by mouth. (Patient not taking: Reported on 12/24/2022)     No current facility-administered medications on file prior to visit.    BP (!) 140/90   Pulse 73   Temp 98.1 F (36.7 C) (Oral)   Ht 6\' 5"  (1.956 m)   Wt 239 lb (108.4 kg)   SpO2 93%   BMI 28.34 kg/m       Objective:   Physical Exam Vitals and nursing note reviewed.  Constitutional:      Appearance: Normal appearance.  HENT:     Right Ear: There is impacted cerumen.     Left Ear: There is impacted cerumen.  Skin:    General: Skin is warm and dry.     Capillary Refill: Capillary refill takes less than 2 seconds.  Neurological:     General: No focal deficit present.     Mental Status: He is alert and oriented to person, place, and time.  Psychiatric:        Mood and Affect: Mood normal.        Behavior: Behavior normal.        Thought Content: Thought content normal.        Judgment: Judgment normal.       Assessment & Plan:  1. Bilateral impacted cerumen Ear Cerumen  Removal  Date/Time: 12/24/2022 7:48 AM  Performed by: Shirline Frees, NP Authorized by: Shirline Frees, NP   Anesthesia: Local Anesthetic: none Ceruminolytics applied: Ceruminolytics applied prior to the procedure. Location details: right ear and  left ear Patient tolerance: patient tolerated the procedure well with no immediate complications Comments: Cerumen was removed with irrigation. No signs of trauma after irrigation. TM visible. Patient reported relief in symptoms once irrigation was complete  Procedure type: irrigation  Sedation: Patient sedated: no    Follow up as needed

## 2023-03-15 ENCOUNTER — Other Ambulatory Visit: Payer: Self-pay | Admitting: Adult Health

## 2023-03-15 DIAGNOSIS — N521 Erectile dysfunction due to diseases classified elsewhere: Secondary | ICD-10-CM

## 2023-04-21 ENCOUNTER — Encounter (INDEPENDENT_AMBULATORY_CARE_PROVIDER_SITE_OTHER): Payer: Self-pay

## 2023-04-29 ENCOUNTER — Other Ambulatory Visit: Payer: Self-pay | Admitting: Adult Health

## 2023-04-29 DIAGNOSIS — I10 Essential (primary) hypertension: Secondary | ICD-10-CM

## 2023-04-29 DIAGNOSIS — K219 Gastro-esophageal reflux disease without esophagitis: Secondary | ICD-10-CM

## 2023-05-25 ENCOUNTER — Encounter: Payer: Self-pay | Admitting: Pharmacist

## 2023-06-01 ENCOUNTER — Ambulatory Visit: Payer: 59 | Admitting: Adult Health

## 2023-06-01 ENCOUNTER — Other Ambulatory Visit (INDEPENDENT_AMBULATORY_CARE_PROVIDER_SITE_OTHER): Payer: 59

## 2023-06-01 VITALS — BP 124/86 | HR 60 | Temp 98.0°F | Ht 75.25 in | Wt 242.0 lb

## 2023-06-01 DIAGNOSIS — Z7984 Long term (current) use of oral hypoglycemic drugs: Secondary | ICD-10-CM

## 2023-06-01 DIAGNOSIS — Z23 Encounter for immunization: Secondary | ICD-10-CM

## 2023-06-01 DIAGNOSIS — E119 Type 2 diabetes mellitus without complications: Secondary | ICD-10-CM | POA: Diagnosis not present

## 2023-06-01 DIAGNOSIS — Z125 Encounter for screening for malignant neoplasm of prostate: Secondary | ICD-10-CM | POA: Diagnosis not present

## 2023-06-01 DIAGNOSIS — K219 Gastro-esophageal reflux disease without esophagitis: Secondary | ICD-10-CM

## 2023-06-01 DIAGNOSIS — Z0000 Encounter for general adult medical examination without abnormal findings: Secondary | ICD-10-CM

## 2023-06-01 DIAGNOSIS — N521 Erectile dysfunction due to diseases classified elsewhere: Secondary | ICD-10-CM

## 2023-06-01 DIAGNOSIS — I10 Essential (primary) hypertension: Secondary | ICD-10-CM | POA: Diagnosis not present

## 2023-06-01 LAB — COMPREHENSIVE METABOLIC PANEL
ALT: 13 U/L (ref 0–53)
AST: 14 U/L (ref 0–37)
Albumin: 3.5 g/dL (ref 3.5–5.2)
Alkaline Phosphatase: 59 U/L (ref 39–117)
BUN: 16 mg/dL (ref 6–23)
CO2: 29 mEq/L (ref 19–32)
Calcium: 8.9 mg/dL (ref 8.4–10.5)
Chloride: 108 mEq/L (ref 96–112)
Creatinine, Ser: 0.95 mg/dL (ref 0.40–1.50)
GFR: 83.23 mL/min (ref >60.00)
Glucose, Bld: 96 mg/dL (ref 70–99)
Potassium: 4.2 mEq/L (ref 3.5–5.1)
Sodium: 141 mEq/L (ref 135–145)
Total Bilirubin: 0.4 mg/dL (ref 0.2–1.2)
Total Protein: 7.1 g/dL (ref 6.0–8.3)

## 2023-06-01 LAB — CBC
HCT: 35.7 % — ABNORMAL LOW (ref 39.0–52.0)
Hemoglobin: 10.9 g/dL — ABNORMAL LOW (ref 13.0–17.0)
MCHC: 30.7 g/dL (ref 30.0–36.0)
MCV: 74.8 fl — ABNORMAL LOW (ref 78.0–100.0)
Platelets: 389 10*3/uL (ref 150.0–400.0)
RBC: 4.77 Mil/uL (ref 4.22–5.81)
RDW: 17.9 % — ABNORMAL HIGH (ref 11.5–15.5)
WBC: 5.1 10*3/uL (ref 4.0–10.5)

## 2023-06-01 LAB — PSA: PSA: 4.27 ng/mL — ABNORMAL HIGH (ref 0.10–4.00)

## 2023-06-01 LAB — LIPID PANEL
Cholesterol: 121 mg/dL (ref 0–200)
HDL: 44 mg/dL (ref >39.00)
LDL Cholesterol: 65 mg/dL (ref 0–99)
NonHDL: 77.01
Total CHOL/HDL Ratio: 3
Triglycerides: 59 mg/dL (ref 0.0–149.0)
VLDL: 11.8 mg/dL (ref 0.0–40.0)

## 2023-06-01 LAB — HEMOGLOBIN A1C: Hgb A1c MFr Bld: 6.2 % (ref 4.6–6.5)

## 2023-06-01 LAB — MICROALBUMIN / CREATININE URINE RATIO
Creatinine,U: 128.3 mg/dL
Microalb Creat Ratio: 0.5 mg/g (ref 0.0–30.0)
Microalb, Ur: <0.7 mg/dL (ref 0.0–1.9)

## 2023-06-01 LAB — TSH: TSH: 1.63 u[IU]/mL (ref 0.35–5.50)

## 2023-06-01 MED ORDER — FUROSEMIDE 40 MG PO TABS: 40.0000 mg | ORAL_TABLET | Freq: Every day | ORAL | 11 refills | Status: DC

## 2023-06-01 MED ORDER — SILDENAFIL CITRATE 100 MG PO TABS: ORAL_TABLET | ORAL | 0 refills | Status: DC

## 2023-06-01 MED ORDER — DOXAZOSIN MESYLATE 8 MG PO TABS: ORAL_TABLET | ORAL | 11 refills | Status: DC

## 2023-06-01 NOTE — Progress Notes (Signed)
Subjective:    Patient ID: [name], male    DOB: February 18, 1956, 67 y.o.   MRN: [id number]  HPI Patient presents for yearly preventative medicine examination. HE is a pleasant 67 year old male who  has a past medical history of Arthritis, Back pain, GERD (gastroesophageal reflux disease), Hypertension, and Sciatica associated with disorder of lumbar spine.  Dm Type 2- Prescribed Metformin 500 mg daily but never picked up this medication. It sounds like the message was lost in translation.  Lab Results  Component Value Date   HGBA1C 6.5 05/27/2022   HTN -managed with Vasotec 20 mg daily, Cardura 8 mg, and Lasix 40 mg daily.  He denies chest pain, dizziness, lightheadedness, or syncopal episodes BP Readings from Last 3 Encounters:  06/01/23 124/86  12/24/22 (!) 140/90  08/13/22 118/79    GERD-controlled with Prilosec 40 mg daily  Erectile dysfunction-takes Viagra as needed  BPH - take Cardura 8 mg. Denies symptoms.   Chronic back pain - has epidural injections periodically at [facility]. Reports that injections help due to some degree.  All immunizations and health maintenance protocols were reviewed with the patient and needed orders were placed.  Appropriate screening laboratory values were ordered for the patient including screening of hyperlipidemia, renal function and hepatic function. If indicated by BPH, a PSA was ordered.  Medication reconciliation,  past medical history, social history, problem list and allergies were reviewed in detail with the patient  Goals were established with regard to weight loss, exercise, and  diet in compliance with medications  Wt Readings from Last 3 Encounters:  06/01/23 242 lb (109.8 kg)  12/24/22 239 lb (108.4 kg)  08/13/22 234 lb (106.1 kg)    Review of Systems  Constitutional: Negative.   HENT: Negative.    Eyes: Negative.   Respiratory: Negative.    Cardiovascular: Negative.   Gastrointestinal: Negative.   Endocrine:  Negative.   Genitourinary: Negative.   Musculoskeletal:  Positive for arthralgias and back pain.  Skin: Negative.   Allergic/Immunologic: Negative.   Neurological: Negative.   Hematological: Negative.   Psychiatric/Behavioral: Negative.    All other systems reviewed and are negative.  Past Medical History:  Diagnosis Date   Arthritis    spine,hips,shoulders   Back pain    injection 08/09/22   GERD (gastroesophageal reflux disease)    Hypertension    Sciatica associated with disorder of lumbar spine     Social History   Socioeconomic History   Marital status: Married    Spouse name: Not on file   Number of children: Not on file   Years of education: Not on file   Highest education level: 12th grade  Occupational History   Not on file  Tobacco Use   Smoking status: Former    Current packs/day: 0.00    Average packs/day: 2.0 packs/day for 23.0 years (46.0 ttl pk-yrs)    Types: Cigarettes    Start date: 01/12/1968    Quit date: 01/12/1991    Years since quitting: 32.4   Smokeless tobacco: Never  Vaping Use   Vaping status: Never Used  Substance and Sexual Activity   Alcohol use: No   Drug use: No   Sexual activity: Not on file  Other Topics Concern   Not on file  Social History Narrative   Not on file   Social Determinants of Health   Financial Resource Strain: Low Risk  (12/23/2022)   Overall Financial Resource Strain (CARDIA)    Difficulty  of Paying Living Expenses: Not hard at all  Food Insecurity: No Food Insecurity (12/23/2022)   Hunger Vital Sign    Worried About Running Out of Food in the Last Year: Never true    Ran Out of Food in the Last Year: Never true  Transportation Needs: No Transportation Needs (12/23/2022)   PRAPARE - Administrator, Civil Service (Medical): No    Lack of Transportation (Non-Medical): No  Physical Activity: Sufficiently Active (12/23/2022)   Exercise Vital Sign    Days of Exercise per Week: 6 days    Minutes of  Exercise per Session: 30 min  Stress: No Stress Concern Present (12/23/2022)   [employer] of Occupational Health - Occupational Stress Questionnaire    Feeling of Stress : Not at all  Social Connections: Socially Integrated (12/23/2022)   Social Connection and Isolation Panel [NHANES]    Frequency of Communication with Friends and Family: Three times a week    Frequency of Social Gatherings with Friends and Family: Once a week    Attends Religious Services: 1 to 4 times per year    Active Member of Clubs or Organizations: Yes    Attends Banker Meetings: 1 to 4 times per year    Marital Status: Married  Catering manager Violence: Not on file    Past Surgical History:  Procedure Laterality Date   KNEE ARTHROPLASTY Right 2014   KNEE ARTHROPLASTY Left 2012   KNEE ARTHROSCOPY Left 2011   REVERSE SHOULDER ARTHROPLASTY Left 08/13/2022   Procedure: REVERSE SHOULDER ARTHROPLASTY;  Surgeon: Beverely Low, MD;  Location: [facility];  Service: Orthopedics;  Laterality: Left;  with ISB   REVISION TOTAL KNEE ARTHROPLASTY Left 2016   SHOULDER SURGERY Left 2011    Family History  Problem Relation Age of Onset   Colon polyps Neg Hx    Rectal cancer Neg Hx    Stomach cancer Neg Hx    Esophageal cancer Neg Hx     No Known Allergies  Current Outpatient Medications on File Prior to Visit  Medication Sig Dispense Refill   Aspirin-Caffeine (BC FAST PAIN RELIEF ARTHRITIS) 1000-65 MG PACK Take 1 packet by mouth 2 (two) times daily as needed (pain).     doxazosin (CARDURA) 8 MG tablet TAKE 1 TABLET BY MOUTH EVERYDAY AT BEDTIME 90 tablet 3   enalapril (VASOTEC) 20 MG tablet TAKE 1 TABLET BY MOUTH EVERY DAY 30 tablet 11   fexofenadine (ALLEGRA) 180 MG tablet Take by mouth.     furosemide (LASIX) 40 MG tablet Take 1 tablet (40 mg total) by mouth 2 (two) times daily. (Patient taking differently: Take 40 mg by mouth daily.) 180 tablet 3   Multiple Vitamin (MULTI-VITAMIN) tablet Take 1  tablet by mouth daily.     Multiple Vitamins-Minerals (CENTRUM SILVER 50+MEN PO) Take 1 tablet by mouth daily.     omeprazole (PRILOSEC) 20 MG capsule TAKE 1 CAPSULE BY MOUTH EVERY DAY 30 capsule 11   sildenafil (VIAGRA) 100 MG tablet take half to one tablet by mouth daily as needed for erectile dysfucntion 90 tablet 0   No current facility-administered medications on file prior to visit.    BP 124/86   Pulse 60   Temp 98 F (36.7 C) (Oral)   Ht 6' 3.25" (1.911 m)   Wt 242 lb (109.8 kg)   SpO2 93%   BMI 30.05 kg/m       Objective:   Physical Exam Vitals and nursing note reviewed.  Constitutional:      General: He is not in acute distress.    Appearance: Normal appearance. He is not ill-appearing.  HENT:     Head: Normocephalic and atraumatic.     Right Ear: Tympanic membrane, ear canal and external ear normal. There is no impacted cerumen.     Left Ear: Tympanic membrane, ear canal and external ear normal. There is no impacted cerumen.     Nose: Nose normal. No congestion or rhinorrhea.     Mouth/Throat:     Mouth: Mucous membranes are moist.     Pharynx: Oropharynx is clear.  Eyes:     Extraocular Movements: Extraocular movements intact.     Conjunctiva/sclera: Conjunctivae normal.     Pupils: Pupils are equal, round, and reactive to light.  Neck:     Vascular: No carotid bruit.  Cardiovascular:     Rate and Rhythm: Normal rate and regular rhythm.     Pulses: Normal pulses.     Heart sounds: No murmur heard.    No friction rub. No gallop.  Pulmonary:     Effort: Pulmonary effort is normal.     Breath sounds: Normal breath sounds.  Abdominal:     General: Abdomen is flat. Bowel sounds are normal. There is no distension.     Palpations: Abdomen is soft. There is no mass.     Tenderness: There is no abdominal tenderness. There is no guarding or rebound.     Hernia: No hernia is present.  Musculoskeletal:        General: Normal range of motion.     Cervical back:  Normal range of motion and neck supple.  Lymphadenopathy:     Cervical: No cervical adenopathy.  Skin:    General: Skin is warm and dry.     Capillary Refill: Capillary refill takes less than 2 seconds.  Neurological:     General: No focal deficit present.     Mental Status: He is alert and oriented to person, place, and time.  Psychiatric:        Mood and Affect: Mood normal.        Behavior: Behavior normal.        Thought Content: Thought content normal.        Judgment: Judgment normal.        Assessment & Plan:  1. Routine general medical examination at a health care facility Today patient counseled on age appropriate routine health concerns for screening and prevention, each reviewed and up to date or declined. Immunizations reviewed and up to date or declined. Labs ordered and reviewed. Risk factors for depression reviewed and negative. Hearing function and visual acuity are intact. ADLs screened and addressed as needed. Functional ability and level of safety reviewed and appropriate. Education, counseling and referrals performed based on assessed risks today. Patient provided with a copy of personalized plan for preventive services. - Eat healthy and exercise - Follow up in one year or sooner if needed  - Flu shot given today   2. Diabetes mellitus treated with oral medication (HCC) - Check A1c today and consider putting back on Metformin  - 3 or 6 months follow up  - Work on reducing sugars and carbs  - Lipid panel; Future - TSH; Future - CBC; Future - Comprehensive metabolic panel; Future - Hemoglobin A1c; Future - Microalbumin / creatinine urine ratio; Future  3. Essential hypertension - Well controlled. No change in medication  - Lipid panel; Future - TSH; Future -  CBC; Future - Comprehensive metabolic panel; Future  4. Gastroesophageal reflux disease without esophagitis - Continue PPI  - Lipid panel; Future - TSH; Future - CBC; Future - Comprehensive  metabolic panel; Future  5. Erectile dysfunction due to diseases classified elsewhere  - Lipid panel; Future - TSH; Future - CBC; Future - Comprehensive metabolic panel; Future  6. Prostate cancer screening  - PSA; Future  7. Need for influenza vaccination  - Flu Vaccine Trivalent High Dose (Fluad)  Shirline Frees, NP

## 2023-06-01 NOTE — Patient Instructions (Signed)
It was great seeing you today   We will follow up with you regarding your lab work   Please let me know if you need anything   

## 2023-06-02 ENCOUNTER — Other Ambulatory Visit: Payer: Self-pay | Admitting: Adult Health

## 2023-06-02 DIAGNOSIS — D509 Iron deficiency anemia, unspecified: Secondary | ICD-10-CM

## 2023-06-02 DIAGNOSIS — R972 Elevated prostate specific antigen [PSA]: Secondary | ICD-10-CM

## 2023-07-05 ENCOUNTER — Other Ambulatory Visit (INDEPENDENT_AMBULATORY_CARE_PROVIDER_SITE_OTHER): Payer: 59

## 2023-07-05 DIAGNOSIS — R972 Elevated prostate specific antigen [PSA]: Secondary | ICD-10-CM

## 2023-07-05 DIAGNOSIS — D509 Iron deficiency anemia, unspecified: Secondary | ICD-10-CM | POA: Diagnosis not present

## 2023-07-05 LAB — IBC + FERRITIN
Ferritin: 16.5 ng/mL — ABNORMAL LOW (ref 22.0–322.0)
Iron: 26 ug/dL — ABNORMAL LOW (ref 42–165)
Saturation Ratios: 8 % — ABNORMAL LOW (ref 20.0–50.0)
TIBC: 326.2 ug/dL (ref 250.0–450.0)
Transferrin: 233 mg/dL (ref 212.0–360.0)

## 2023-07-05 LAB — CBC
HCT: 33.9 % — ABNORMAL LOW (ref 39.0–52.0)
Hemoglobin: 10.5 g/dL — ABNORMAL LOW (ref 13.0–17.0)
MCHC: 31.1 g/dL (ref 30.0–36.0)
MCV: 75.3 fL — ABNORMAL LOW (ref 78.0–100.0)
Platelets: 346 10*3/uL (ref 150.0–400.0)
RBC: 4.5 Mil/uL (ref 4.22–5.81)
RDW: 17.9 % — ABNORMAL HIGH (ref 11.5–15.5)
WBC: 5.7 10*3/uL (ref 4.0–10.5)

## 2023-07-05 LAB — PSA: PSA: 3.47 ng/mL (ref 0.10–4.00)

## 2023-07-12 ENCOUNTER — Telehealth: Payer: Self-pay | Admitting: Adult Health

## 2023-07-12 NOTE — Telephone Encounter (Signed)
Pt called, returning CMA's call, regarding lab results. CMA was with a Pt.  Pt asked that CMA call back at her earliest convenience.

## 2023-07-13 NOTE — Telephone Encounter (Signed)
Patient notified of update  and verbalized understanding. 

## 2023-10-24 ENCOUNTER — Other Ambulatory Visit: Payer: Self-pay | Admitting: Adult Health

## 2023-10-24 DIAGNOSIS — I10 Essential (primary) hypertension: Secondary | ICD-10-CM

## 2024-03-05 ENCOUNTER — Other Ambulatory Visit: Payer: Self-pay | Admitting: Adult Health

## 2024-03-05 DIAGNOSIS — N521 Erectile dysfunction due to diseases classified elsewhere: Secondary | ICD-10-CM

## 2024-04-25 ENCOUNTER — Ambulatory Visit: Payer: Self-pay | Admitting: Adult Health

## 2024-04-25 ENCOUNTER — Ambulatory Visit: Admitting: Adult Health

## 2024-04-25 ENCOUNTER — Encounter: Payer: Self-pay | Admitting: Adult Health

## 2024-04-25 VITALS — BP 120/88 | HR 82 | Temp 98.2°F | Ht 75.25 in | Wt 249.0 lb

## 2024-04-25 DIAGNOSIS — K625 Hemorrhage of anus and rectum: Secondary | ICD-10-CM | POA: Diagnosis not present

## 2024-04-25 DIAGNOSIS — G8929 Other chronic pain: Secondary | ICD-10-CM

## 2024-04-25 DIAGNOSIS — M545 Low back pain, unspecified: Secondary | ICD-10-CM | POA: Diagnosis not present

## 2024-04-25 LAB — COMPREHENSIVE METABOLIC PANEL WITH GFR
ALT: 14 U/L (ref 0–53)
AST: 13 U/L (ref 0–37)
Albumin: 3.7 g/dL (ref 3.5–5.2)
Alkaline Phosphatase: 52 U/L (ref 39–117)
BUN: 20 mg/dL (ref 6–23)
CO2: 30 meq/L (ref 19–32)
Calcium: 9 mg/dL (ref 8.4–10.5)
Chloride: 108 meq/L (ref 96–112)
Creatinine, Ser: 0.9 mg/dL (ref 0.40–1.50)
GFR: 88.24 mL/min (ref >60.00)
Glucose, Bld: 93 mg/dL (ref 70–99)
Potassium: 5 meq/L (ref 3.5–5.1)
Sodium: 143 meq/L (ref 135–145)
Total Bilirubin: 0.3 mg/dL (ref 0.2–1.2)
Total Protein: 6.7 g/dL (ref 6.0–8.3)

## 2024-04-25 LAB — CBC
HCT: 35.4 % — ABNORMAL LOW (ref 39.0–52.0)
Hemoglobin: 11.2 g/dL — ABNORMAL LOW (ref 13.0–17.0)
MCHC: 31.7 g/dL (ref 30.0–36.0)
MCV: 79.7 fl (ref 78.0–100.0)
Platelets: 302 K/uL (ref 150.0–400.0)
RBC: 4.44 Mil/uL (ref 4.22–5.81)
RDW: 15.8 % — ABNORMAL HIGH (ref 11.5–15.5)
WBC: 5.9 K/uL (ref 4.0–10.5)

## 2024-04-25 LAB — IBC + FERRITIN
Ferritin: 37.2 ng/mL (ref 22.0–322.0)
Iron: 65 ug/dL (ref 42–165)
Saturation Ratios: 21.8 % (ref 20.0–50.0)
TIBC: 298.2 ug/dL (ref 250.0–450.0)
Transferrin: 213 mg/dL (ref 212.0–360.0)

## 2024-04-25 MED ORDER — PANTOPRAZOLE SODIUM 40 MG PO TBEC: 40.0000 mg | DELAYED_RELEASE_TABLET | Freq: Every day | ORAL | 1 refills | Status: AC

## 2024-04-25 NOTE — Progress Notes (Signed)
 Subjective:    Patient ID: [name], male    DOB: 1956-05-04, 68 y.o.   MRN: [id number]  HPI 68 year old male who  has a past medical history of Arthritis, Back pain, GERD (gastroesophageal reflux disease), Hypertension, and Sciatica associated with disorder of lumbar spine.  Discussed the use of AI scribe software for clinical note transcription with the patient, who gave verbal consent to proceed.  History of Present Illness   [name] is a 68 year old male who presents for an acute issue.   He has experienced rectal bleeding episodes two or three times, with dark red blood filling the commode. These episodes usually resolve within a day, but the most recent episode began three days ago and has continued over the last 2 days ( he has not had a bowel movement today.  He has no abdominal pain, nausea, vomiting, or diarrhea, although the stool was liquid.  His medical history includes  diverticula noted on a 2018 colonoscopy, without diverticulitis and iron deficiency.  He takes [facility] powders for back pain, exceeding the recommended dose, and an iron supplement daily. He denies gastric pain       Review of Systems See HPI   Past Medical History:  Diagnosis Date   Arthritis    spine,hips,shoulders   Back pain    injection 08/09/22   GERD (gastroesophageal reflux disease)    Hypertension    Sciatica associated with disorder of lumbar spine     Social History   Socioeconomic History   Marital status: Married    Spouse name: Not on file   Number of children: Not on file   Years of education: Not on file   Highest education level: 12th grade  Occupational History   Not on file  Tobacco Use   Smoking status: Former    Current packs/day: 0.00    Average packs/day: 2.0 packs/day for 23.0 years (46.0 ttl pk-yrs)    Types: Cigarettes    Start date: 01/12/1968    Quit date: 01/12/1991    Years since quitting: 33.3   Smokeless tobacco: Never  Vaping Use   Vaping  status: Never Used  Substance and Sexual Activity   Alcohol use: No   Drug use: No   Sexual activity: Not on file  Other Topics Concern   Not on file  Social History Narrative   Not on file   Social Drivers of Health   Financial Resource Strain: Low Risk  (12/23/2022)   Overall Financial Resource Strain (CARDIA)    Difficulty of Paying Living Expenses: Not hard at all  Food Insecurity: No Food Insecurity (12/23/2022)   Hunger Vital Sign    Worried About Running Out of Food in the Last Year: Never true    Ran Out of Food in the Last Year: Never true  Transportation Needs: No Transportation Needs (12/23/2022)   PRAPARE - Administrator, Civil Service (Medical): No    Lack of Transportation (Non-Medical): No  Physical Activity: Sufficiently Active (12/23/2022)   Exercise Vital Sign    Days of Exercise per Week: 6 days    Minutes of Exercise per Session: 30 min  Stress: No Stress Concern Present (12/23/2022)   [employer] of Occupational Health - Occupational Stress Questionnaire    Feeling of Stress : Not at all  Social Connections: Socially Integrated (12/23/2022)   Social Connection and Isolation Panel    Frequency of Communication with Friends and Family: Three  times a week    Frequency of Social Gatherings with Friends and Family: Once a week    Attends Religious Services: 1 to 4 times per year    Active Member of Clubs or Organizations: Yes    Attends Banker Meetings: 1 to 4 times per year    Marital Status: Married  Catering manager Violence: Not on file    Past Surgical History:  Procedure Laterality Date   KNEE ARTHROPLASTY Right 2014   KNEE ARTHROPLASTY Left 2012   KNEE ARTHROSCOPY Left 2011   REVERSE SHOULDER ARTHROPLASTY Left 08/13/2022   Procedure: REVERSE SHOULDER ARTHROPLASTY;  Surgeon: Kay Kemps, MD;  Location: [facility];  Service: Orthopedics;  Laterality: Left;  with ISB   REVISION TOTAL KNEE ARTHROPLASTY Left 2016   SHOULDER  SURGERY Left 2011    Family History  Problem Relation Age of Onset   Colon polyps Neg Hx    Rectal cancer Neg Hx    Stomach cancer Neg Hx    Esophageal cancer Neg Hx     No Known Allergies  Current Outpatient Medications on File Prior to Visit  Medication Sig Dispense Refill   Aspirin-Caffeine (BC FAST PAIN RELIEF ARTHRITIS) 1000-65 MG PACK Take 1 packet by mouth 2 (two) times daily as needed (pain).     doxazosin  (CARDURA ) 8 MG tablet TAKE 1 TABLET BY MOUTH EVERYDAY AT BEDTIME 30 tablet 11   enalapril  (VASOTEC ) 20 MG tablet TAKE 1 TABLET BY MOUTH EVERY DAY 30 tablet 11   fexofenadine (ALLEGRA) 180 MG tablet Take by mouth.     furosemide  (LASIX ) 40 MG tablet TAKE 1 TABLET BY MOUTH TWICE A DAY 60 tablet 11   Multiple Vitamin (MULTI-VITAMIN) tablet Take 1 tablet by mouth daily.     Multiple Vitamins-Minerals (CENTRUM SILVER 50+MEN PO) Take 1 tablet by mouth daily.     sildenafil  (VIAGRA ) 100 MG tablet TAKE HALF TO ONE TABLET BY MOUTH DAILY AS NEEDED FOR ERECTILE DYSFUCNTION 90 tablet 0   No current facility-administered medications on file prior to visit.    BP 120/88   Pulse 82   Temp 98.2 F (36.8 C) (Oral)   Ht 6' 3.25 (1.911 m)   Wt 249 lb (112.9 kg)   SpO2 92%   BMI 30.92 kg/m       Objective:   Physical Exam Vitals and nursing note reviewed.  Constitutional:      Appearance: Normal appearance.  Cardiovascular:     Rate and Rhythm: Normal rate and regular rhythm.     Pulses: Normal pulses.     Heart sounds: Normal heart sounds.  Pulmonary:     Effort: Pulmonary effort is normal.     Breath sounds: Normal breath sounds.  Abdominal:     General: Abdomen is flat. Bowel sounds are normal. There is no distension.     Palpations: Abdomen is soft.     Tenderness: There is no abdominal tenderness.  Genitourinary:    Rectum: Guaiac result positive.  Musculoskeletal:        General: Normal range of motion.  Skin:    General: Skin is warm and dry.  Neurological:      General: No focal deficit present.     Mental Status: He is alert and oriented to person, place, and time.  Psychiatric:        Mood and Affect: Mood normal.        Behavior: Behavior normal.        Thought Content:  Thought content normal.        Judgment: Judgment normal.        Assessment & Plan:  1. Rectal bleeding (Primary) - Guaiac positive in the office this morning. Will check labs and start on Protonix . Referral to GI placed - CBC; Future - IBC + Ferritin; Future - Comprehensive metabolic panel with GFR; Future - pantoprazole  (PROTONIX ) 40 MG tablet; Take 1 tablet (40 mg total) by mouth daily.  Dispense: 90 tablet; Refill: 1 - Ambulatory referral to Gastroenterology - Comprehensive metabolic panel with GFR - IBC + Ferritin - CBC  2. Chronic midline low back pain without sciatica - Stop BC powder and do not take any other NSAIDS. Can take tylenol   - Follow up with orthopedics/pain management as needed  Darleene Shape, NP  I personally spent a total of 31 minutes in the care of the patient today including preparing to see the patient, getting/reviewing separately obtained history, performing a medically appropriate exam/evaluation, counseling and educating, placing orders, documenting clinical information in the EHR, and communicating results.

## 2024-04-26 ENCOUNTER — Telehealth: Payer: Self-pay | Admitting: *Deleted

## 2024-04-26 NOTE — Telephone Encounter (Signed)
 Noted

## 2024-04-26 NOTE — Telephone Encounter (Signed)
 Copied from CRM [id number]. Topic: Clinical - Lab/Test Results >> Apr 26, 2024 11:30 AM [place] J wrote: Reason for CRM: Patient calling for lab results. I was able to successfully relay the patient's results. He has no further questions at this time.

## 2024-05-01 ENCOUNTER — Other Ambulatory Visit: Payer: Self-pay | Admitting: Adult Health

## 2024-05-01 DIAGNOSIS — I10 Essential (primary) hypertension: Secondary | ICD-10-CM

## 2024-06-04 ENCOUNTER — Other Ambulatory Visit: Payer: Self-pay | Admitting: Adult Health

## 2024-06-04 DIAGNOSIS — I10 Essential (primary) hypertension: Secondary | ICD-10-CM

## 2024-06-04 DIAGNOSIS — Z125 Encounter for screening for malignant neoplasm of prostate: Secondary | ICD-10-CM

## 2024-06-06 ENCOUNTER — Ambulatory Visit: Admitting: Adult Health

## 2024-06-06 ENCOUNTER — Encounter: Payer: Self-pay | Admitting: Adult Health

## 2024-06-06 VITALS — BP 102/80 | HR 83 | Temp 97.9°F | Ht 75.25 in | Wt 250.0 lb

## 2024-06-06 DIAGNOSIS — K219 Gastro-esophageal reflux disease without esophagitis: Secondary | ICD-10-CM

## 2024-06-06 DIAGNOSIS — N400 Enlarged prostate without lower urinary tract symptoms: Secondary | ICD-10-CM | POA: Diagnosis not present

## 2024-06-06 DIAGNOSIS — Z0000 Encounter for general adult medical examination without abnormal findings: Secondary | ICD-10-CM

## 2024-06-06 DIAGNOSIS — I10 Essential (primary) hypertension: Secondary | ICD-10-CM

## 2024-06-06 DIAGNOSIS — E119 Type 2 diabetes mellitus without complications: Secondary | ICD-10-CM | POA: Diagnosis not present

## 2024-06-06 DIAGNOSIS — G8929 Other chronic pain: Secondary | ICD-10-CM

## 2024-06-06 DIAGNOSIS — Z0001 Encounter for general adult medical examination with abnormal findings: Secondary | ICD-10-CM | POA: Diagnosis not present

## 2024-06-06 DIAGNOSIS — D509 Iron deficiency anemia, unspecified: Secondary | ICD-10-CM

## 2024-06-06 DIAGNOSIS — M545 Low back pain, unspecified: Secondary | ICD-10-CM

## 2024-06-06 DIAGNOSIS — N529 Male erectile dysfunction, unspecified: Secondary | ICD-10-CM

## 2024-06-06 LAB — LIPID PANEL
Cholesterol: 117 mg/dL (ref 0–200)
HDL: 40.1 mg/dL (ref >39.00)
LDL Cholesterol: 64 mg/dL (ref 0–99)
NonHDL: 76.86
Total CHOL/HDL Ratio: 3
Triglycerides: 66 mg/dL (ref 0.0–149.0)
VLDL: 13.2 mg/dL (ref 0.0–40.0)

## 2024-06-06 LAB — COMPREHENSIVE METABOLIC PANEL WITH GFR
ALT: 14 U/L (ref 0–53)
AST: 16 U/L (ref 0–37)
Albumin: 3.6 g/dL (ref 3.5–5.2)
Alkaline Phosphatase: 55 U/L (ref 39–117)
BUN: 15 mg/dL (ref 6–23)
CO2: 30 meq/L (ref 19–32)
Calcium: 9 mg/dL (ref 8.4–10.5)
Chloride: 106 meq/L (ref 96–112)
Creatinine, Ser: 0.93 mg/dL (ref 0.40–1.50)
GFR: 84.77 mL/min (ref >60.00)
Glucose, Bld: 87 mg/dL (ref 70–99)
Potassium: 4.7 meq/L (ref 3.5–5.1)
Sodium: 141 meq/L (ref 135–145)
Total Bilirubin: 0.3 mg/dL (ref 0.2–1.2)
Total Protein: 7 g/dL (ref 6.0–8.3)

## 2024-06-06 LAB — IBC + FERRITIN
Ferritin: 29.5 ng/mL (ref 22.0–322.0)
Iron: 44 ug/dL (ref 42–165)
Saturation Ratios: 14.4 % — ABNORMAL LOW (ref 20.0–50.0)
TIBC: 305.2 ug/dL (ref 250.0–450.0)
Transferrin: 218 mg/dL (ref 212.0–360.0)

## 2024-06-06 LAB — CBC
HCT: 37 % — ABNORMAL LOW (ref 39.0–52.0)
Hemoglobin: 11.8 g/dL — ABNORMAL LOW (ref 13.0–17.0)
MCHC: 31.8 g/dL (ref 30.0–36.0)
MCV: 79.3 fl (ref 78.0–100.0)
Platelets: 325 K/uL (ref 150.0–400.0)
RBC: 4.66 Mil/uL (ref 4.22–5.81)
RDW: 16 % — ABNORMAL HIGH (ref 11.5–15.5)
WBC: 4.8 K/uL (ref 4.0–10.5)

## 2024-06-06 LAB — TSH: TSH: 1.81 u[IU]/mL (ref 0.35–5.50)

## 2024-06-06 LAB — PSA: PSA: 3.35 ng/mL (ref 0.10–4.00)

## 2024-06-06 LAB — HEMOGLOBIN A1C: Hgb A1c MFr Bld: 6.2 % (ref 4.6–6.5)

## 2024-06-06 NOTE — Progress Notes (Signed)
 Subjective:    Patient ID: [name], male    DOB: 02-16-1956, 68 y.o.   MRN: [id number]  HPI Patient presents for yearly preventative medicine examination. He is a pleasant 68 year old male who  has a past medical history of Arthritis, Back pain, GERD (gastroesophageal reflux disease), Hypertension, and Sciatica associated with disorder of lumbar spine.  Dm Type 2- Diet controlled.  Lab Results  Component Value Date   HGBA1C 6.2 06/01/2023   HGBA1C 6.5 05/27/2022   HGBA1C 5.7 02/24/2022   HTN -managed with Vasotec  20 mg daily, Cardura  8 mg, and Lasix  40 mg daily.  He denies chest pain, dizziness, lightheadedness, or syncopal episodes BP Readings from Last 3 Encounters:  06/06/24 102/80  04/25/24 120/88  06/01/23 124/86   GERD-controlled with Prilosec 40 mg daily  Erectile dysfunction-takes Viagra  as needed  BPH - take Cardura  8 mg. Denies symptoms.   Chronic back pain - has epidural injections periodically at [facility]. Reports that injections help due to some degree.  Iron Deficiency Anemia - takes OTC iron supplement   All immunizations and health maintenance protocols were reviewed with the patient and needed orders were placed. He is up to date on routine vaccinations.   Appropriate screening laboratory values were ordered for the patient including screening of hyperlipidemia, renal function and hepatic function. If indicated by BPH, a PSA was ordered.  Medication reconciliation,  past medical history, social history, problem list and allergies were reviewed in detail with the patient  Goals were established with regard to weight loss, exercise, and  diet in compliance with medications. He stays very active at his job and tries to eat healthy.  Wt Readings from Last 3 Encounters:  06/06/24 250 lb (113.4 kg)  04/25/24 249 lb (112.9 kg)  06/01/23 242 lb (109.8 kg)   He denies any acute complaints.   Review of Systems  Constitutional: Negative.   HENT:  Negative.    Eyes: Negative.   Respiratory: Negative.    Cardiovascular: Negative.   Gastrointestinal: Negative.   Endocrine: Negative.   Genitourinary: Negative.   Musculoskeletal:  Positive for arthralgias and back pain.  Skin: Negative.   Allergic/Immunologic: Negative.   Neurological: Negative.   Hematological: Negative.   Psychiatric/Behavioral: Negative.    All other systems reviewed and are negative.  Past Medical History:  Diagnosis Date   Arthritis    spine,hips,shoulders   Back pain    injection 08/09/22   GERD (gastroesophageal reflux disease)    Hypertension    Sciatica associated with disorder of lumbar spine     Social History   Socioeconomic History   Marital status: Married    Spouse name: Not on file   Number of children: Not on file   Years of education: Not on file   Highest education level: 12th grade  Occupational History   Not on file  Tobacco Use   Smoking status: Former    Current packs/day: 0.00    Average packs/day: 2.0 packs/day for 23.0 years (46.0 ttl pk-yrs)    Types: Cigarettes    Start date: 01/12/1968    Quit date: 01/12/1991    Years since quitting: 33.4   Smokeless tobacco: Never  Vaping Use   Vaping status: Never Used  Substance and Sexual Activity   Alcohol use: No   Drug use: No   Sexual activity: Not on file  Other Topics Concern   Not on file  Social History Narrative   Not on  file   Social Drivers of Health   Financial Resource Strain: Low Risk  (06/02/2024)   Overall Financial Resource Strain (CARDIA)    Difficulty of Paying Living Expenses: Not hard at all  Food Insecurity: No Food Insecurity (06/02/2024)   Hunger Vital Sign    Worried About Running Out of Food in the Last Year: Never true    Ran Out of Food in the Last Year: Never true  Transportation Needs: Unknown (06/02/2024)   PRAPARE - Administrator, Civil Service (Medical): No    Lack of Transportation (Non-Medical): Not on file  Physical  Activity: Insufficiently Active (06/02/2024)   Exercise Vital Sign    Days of Exercise per Week: 4 days    Minutes of Exercise per Session: 30 min  Stress: No Stress Concern Present (06/02/2024)   [employer] of Occupational Health - Occupational Stress Questionnaire    Feeling of Stress: Not at all  Social Connections: Socially Integrated (06/02/2024)   Social Connection and Isolation Panel    Frequency of Communication with Friends and Family: More than three times a week    Frequency of Social Gatherings with Friends and Family: Twice a week    Attends Religious Services: More than 4 times per year    Active Member of Clubs or Organizations: Yes    Attends Banker Meetings: 1 to 4 times per year    Marital Status: Married  Catering manager Violence: Not on file    Past Surgical History:  Procedure Laterality Date   KNEE ARTHROPLASTY Right 2014   KNEE ARTHROPLASTY Left 2012   KNEE ARTHROSCOPY Left 2011   REVERSE SHOULDER ARTHROPLASTY Left 08/13/2022   Procedure: REVERSE SHOULDER ARTHROPLASTY;  Surgeon: Kay Kemps, MD;  Location: [facility];  Service: Orthopedics;  Laterality: Left;  with ISB   REVISION TOTAL KNEE ARTHROPLASTY Left 2016   SHOULDER SURGERY Left 2011    Family History  Problem Relation Age of Onset   Colon polyps Neg Hx    Rectal cancer Neg Hx    Stomach cancer Neg Hx    Esophageal cancer Neg Hx     No Known Allergies  Current Outpatient Medications on File Prior to Visit  Medication Sig Dispense Refill   Aspirin-Caffeine (BC FAST PAIN RELIEF ARTHRITIS) 1000-65 MG PACK Take 1 packet by mouth 2 (two) times daily as needed (pain).     doxazosin  (CARDURA ) 8 MG tablet TAKE 1 TABLET BY MOUTH EVERYDAY AT BEDTIME 30 tablet 11   enalapril  (VASOTEC ) 20 MG tablet TAKE 1 TABLET BY MOUTH EVERY DAY 30 tablet 11   fexofenadine (ALLEGRA) 180 MG tablet Take by mouth.     furosemide  (LASIX ) 40 MG tablet TAKE 1 TABLET BY MOUTH TWICE A DAY 60 tablet 11    Multiple Vitamin (MULTI-VITAMIN) tablet Take 1 tablet by mouth daily.     Multiple Vitamins-Minerals (CENTRUM SILVER 50+MEN PO) Take 1 tablet by mouth daily.     pantoprazole  (PROTONIX ) 40 MG tablet Take 1 tablet (40 mg total) by mouth daily. 90 tablet 1   sildenafil  (VIAGRA ) 100 MG tablet TAKE HALF TO ONE TABLET BY MOUTH DAILY AS NEEDED FOR ERECTILE DYSFUCNTION 90 tablet 0   No current facility-administered medications on file prior to visit.    BP 102/80   Pulse 83   Temp 97.9 F (36.6 C) (Oral)   Ht 6' 3.25 (1.911 m)   Wt 250 lb (113.4 kg)   SpO2 94%   BMI 31.04 kg/m  Objective:   Physical Exam Vitals and nursing note reviewed.  Constitutional:      General: He is not in acute distress.    Appearance: Normal appearance. He is not ill-appearing.  HENT:     Head: Normocephalic and atraumatic.     Right Ear: Tympanic membrane, ear canal and external ear normal. There is no impacted cerumen.     Left Ear: Tympanic membrane, ear canal and external ear normal. There is no impacted cerumen.     Nose: Nose normal. No congestion or rhinorrhea.     Mouth/Throat:     Mouth: Mucous membranes are moist.     Pharynx: Oropharynx is clear.  Eyes:     Extraocular Movements: Extraocular movements intact.     Conjunctiva/sclera: Conjunctivae normal.     Pupils: Pupils are equal, round, and reactive to light.  Neck:     Vascular: No carotid bruit.  Cardiovascular:     Rate and Rhythm: Normal rate and regular rhythm.     Pulses: Normal pulses.     Heart sounds: No murmur heard.    No friction rub. No gallop.  Pulmonary:     Effort: Pulmonary effort is normal.     Breath sounds: Normal breath sounds.  Abdominal:     General: Abdomen is flat. Bowel sounds are normal. There is no distension.     Palpations: Abdomen is soft. There is no mass.     Tenderness: There is no abdominal tenderness. There is no guarding or rebound.     Hernia: No hernia is present.  Musculoskeletal:         General: Normal range of motion.     Cervical back: Normal range of motion and neck supple.  Lymphadenopathy:     Cervical: No cervical adenopathy.  Skin:    General: Skin is warm and dry.     Capillary Refill: Capillary refill takes less than 2 seconds.  Neurological:     General: No focal deficit present.     Mental Status: He is alert and oriented to person, place, and time.  Psychiatric:        Mood and Affect: Mood normal.        Behavior: Behavior normal.        Thought Content: Thought content normal.        Judgment: Judgment normal.        Assessment & Plan:   1. Routine general medical examination at a health care facility (Primary) Today patient counseled on age appropriate routine health concerns for screening and prevention, each reviewed and up to date or declined. Immunizations reviewed and up to date or declined. Labs ordered and reviewed. Risk factors for depression reviewed and negative. Hearing function and visual acuity are intact. ADLs screened and addressed as needed. Functional ability and level of safety reviewed and appropriate. Education, counseling and referrals performed based on assessed risks today. Patient provided with a copy of personalized plan for preventive services.   2. Diet-controlled diabetes mellitus (HCC) - Consider adding agent - Lipid panel; Future - TSH; Future - CBC; Future - Comprehensive metabolic panel with GFR; Future - PSA; Future - Hemoglobin A1c; Future - Microalbumin/Creatinine Ratio, Urine  3. Essential hypertension - Well controlled. No change in medication  - Lipid panel; Future - TSH; Future - CBC; Future - Comprehensive metabolic panel with GFR; Future - PSA; Future - Hemoglobin A1c; Future  4. Gastroesophageal reflux disease without esophagitis - Continue PPI  - Lipid panel; Future -  TSH; Future - CBC; Future - Comprehensive metabolic panel with GFR; Future - PSA; Future - Hemoglobin A1c; Future  5.  Erectile dysfunction, unspecified erectile dysfunction type - Continue Viagra  as needed  6. Benign prostatic hyperplasia without lower urinary tract symptoms - Continue current medication  - PSA; Future  7. Chronic midline low back pain without sciatica - Per orthopedics   8. Iron deficiency anemia, unspecified iron deficiency anemia type  - IBC + Ferritin; Future   Darleene Shape, NP

## 2024-06-07 ENCOUNTER — Ambulatory Visit: Payer: Self-pay | Admitting: Adult Health

## 2024-06-07 LAB — MICROALBUMIN / CREATININE URINE RATIO
Creatinine,U: 172.4 mg/dL
Microalb Creat Ratio: 8.6 mg/g (ref 0.0–30.0)
Microalb, Ur: 1.5 mg/dL (ref 0.0–1.9)

## 2024-07-25 ENCOUNTER — Encounter: Payer: Self-pay | Admitting: Internal Medicine

## 2024-08-25 ENCOUNTER — Encounter: Payer: Self-pay | Admitting: Adult Health

## 2024-09-11 ENCOUNTER — Encounter: Payer: Self-pay | Admitting: Adult Health

## 2024-09-11 ENCOUNTER — Ambulatory Visit: Admitting: Adult Health

## 2024-09-11 VITALS — BP 108/80 | HR 80 | Temp 98.5°F | Ht 75.25 in | Wt 261.0 lb

## 2024-09-11 DIAGNOSIS — Z0110 Encounter for examination of ears and hearing without abnormal findings: Secondary | ICD-10-CM

## 2024-09-11 NOTE — Progress Notes (Signed)
 "  Subjective:    Patient ID: [name], male    DOB: 06-24-1956, 69 y.o.   MRN: [id number]  Ear Fullness       Review of Systems See HPI   Past Medical History:  Diagnosis Date   Arthritis    spine,hips,shoulders   Back pain    injection 08/09/22   GERD (gastroesophageal reflux disease)    Hypertension    Sciatica associated with disorder of lumbar spine     Social History   Socioeconomic History   Marital status: Married    Spouse name: Not on file   Number of children: Not on file   Years of education: Not on file   Highest education level: 12th grade  Occupational History   Not on file  Tobacco Use   Smoking status: Former    Current packs/day: 0.00    Average packs/day: 2.0 packs/day for 23.0 years (46.0 ttl pk-yrs)    Types: Cigarettes    Start date: 01/12/1968    Quit date: 01/12/1991    Years since quitting: 33.6   Smokeless tobacco: Never  Vaping Use   Vaping status: Never Used  Substance and Sexual Activity   Alcohol use: No   Drug use: No   Sexual activity: Not on file  Other Topics Concern   Not on file  Social History Narrative   Not on file   Social Drivers of Health   Tobacco Use: Medium Risk (09/11/2024)   Patient History    Smoking Tobacco Use: Former    Smokeless Tobacco Use: Never    Passive Exposure: Not on Actuary Strain: Low Risk (06/02/2024)   Overall Financial Resource Strain (CARDIA)    Difficulty of Paying Living Expenses: Not hard at all  Food Insecurity: No Food Insecurity (06/02/2024)   Epic    Worried About Radiation Protection Practitioner of Food in the Last Year: Never true    Ran Out of Food in the Last Year: Never true  Transportation Needs: Unknown (06/02/2024)   Epic    Lack of Transportation (Medical): No    Lack of Transportation (Non-Medical): Not on file  Physical Activity: Insufficiently Active (06/02/2024)   Exercise Vital Sign    Days of Exercise per Week: 4 days    Minutes of Exercise per Session: 30 min   Stress: No Stress Concern Present (06/02/2024)   [employer] of Occupational Health - Occupational Stress Questionnaire    Feeling of Stress: Not at all  Social Connections: Socially Integrated (06/02/2024)   Social Connection and Isolation Panel    Frequency of Communication with Friends and Family: More than three times a week    Frequency of Social Gatherings with Friends and Family: Twice a week    Attends Religious Services: More than 4 times per year    Active Member of [employer] or Organizations: Yes    Attends Banker Meetings: 1 to 4 times per year    Marital Status: Married  Catering Manager Violence: Not on file  Depression (PHQ2-9): Low Risk (06/06/2024)   Depression (PHQ2-9)    PHQ-2 Score: 0  Alcohol Screen: Not on file  Housing: Unknown (06/02/2024)   Epic    Unable to Pay for Housing in the Last Year: No    Number of Times Moved in the Last Year: Not on file    Homeless in the Last Year: No  Utilities: Not on file  Health Literacy: Not on file    Past  Surgical History:  Procedure Laterality Date   KNEE ARTHROPLASTY Right 2014   KNEE ARTHROPLASTY Left 2012   KNEE ARTHROSCOPY Left 2011   REVERSE SHOULDER ARTHROPLASTY Left 08/13/2022   Procedure: REVERSE SHOULDER ARTHROPLASTY;  Surgeon: Kay Kemps, MD;  Location: [facility];  Service: Orthopedics;  Laterality: Left;  with ISB   REVISION TOTAL KNEE ARTHROPLASTY Left 2016   SHOULDER SURGERY Left 2011    Family History  Problem Relation Age of Onset   Colon polyps Neg Hx    Rectal cancer Neg Hx    Stomach cancer Neg Hx    Esophageal cancer Neg Hx     Allergies[1]  Medications Ordered Prior to Encounter[2]  BP 108/80   Pulse 80   Temp 98.5 F (36.9 C) (Oral)   Ht 6' 3.25 (1.911 m)   Wt 261 lb (118.4 kg)   SpO2 90%   BMI 32.41 kg/m       Objective:   Physical Exam Vitals and nursing note reviewed.  Constitutional:      Appearance: Normal appearance.  HENT:     Right Ear: Tympanic  membrane, ear canal and external ear normal. There is no impacted cerumen.     Left Ear: Tympanic membrane, ear canal and external ear normal. There is no impacted cerumen.     Ears:     Weber exam findings: Does not lateralize.    Right Rinne: AC > BC.    Left Rinne: AC > BC. Musculoskeletal:        General: Normal range of motion.  Skin:    General: Skin is warm and dry.  Neurological:     General: No focal deficit present.     Mental Status: He is alert and oriented to person, place, and time.  Psychiatric:        Mood and Affect: Mood normal.        Behavior: Behavior normal.        Thought Content: Thought content normal.        Judgment: Judgment normal.        Assessment & Plan:  1. Encounter for hearing examination without abnormal findings (Primary) - No cerumen impaction. On exam today there is no concern for hearing loss. He did not want to be referred to Audiology for more formal testing  Hearing Screening   500Hz  1000Hz  2000Hz  4000Hz   Right ear Pass Pass Pass Fail  Left ear Pass Pass Pass Fail   Darleene Shape, NP      [1]  Allergies Allergen Reactions   Morphine Other (See Comments)    morphine  [2]  Current Outpatient Medications on File Prior to Visit  Medication Sig Dispense Refill   Aspirin-Caffeine (BC FAST PAIN RELIEF ARTHRITIS) 1000-65 MG PACK Take 1 packet by mouth 2 (two) times daily as needed (pain).     doxazosin  (CARDURA ) 8 MG tablet TAKE 1 TABLET BY MOUTH EVERYDAY AT BEDTIME 30 tablet 11   enalapril  (VASOTEC ) 20 MG tablet TAKE 1 TABLET BY MOUTH EVERY DAY 30 tablet 11   fexofenadine (ALLEGRA) 180 MG tablet Take by mouth.     furosemide  (LASIX ) 40 MG tablet TAKE 1 TABLET BY MOUTH TWICE A DAY 60 tablet 11   Multiple Vitamin (MULTI-VITAMIN) tablet Take 1 tablet by mouth daily.     Multiple Vitamins-Minerals (CENTRUM SILVER 50+MEN PO) Take 1 tablet by mouth daily.     pantoprazole  (PROTONIX ) 40 MG tablet Take 1 tablet (40 mg total) by mouth  daily. 90  tablet 1   sildenafil  (VIAGRA ) 100 MG tablet TAKE HALF TO ONE TABLET BY MOUTH DAILY AS NEEDED FOR ERECTILE DYSFUCNTION 90 tablet 0   No current facility-administered medications on file prior to visit.   "
